# Patient Record
Sex: Female | Born: 1955 | Race: White | Hispanic: No | Marital: Married | Smoking: Never smoker
Health system: Southern US, Community
[De-identification: ages and names within clinical notes are randomized; demographics above are authoritative.]

## PROBLEM LIST (undated history)

## (undated) DIAGNOSIS — J301 Allergic rhinitis due to pollen: Secondary | ICD-10-CM

## (undated) DIAGNOSIS — I10 Essential (primary) hypertension: Secondary | ICD-10-CM

## (undated) DIAGNOSIS — G8929 Other chronic pain: Secondary | ICD-10-CM

## (undated) DIAGNOSIS — M7061 Trochanteric bursitis, right hip: Secondary | ICD-10-CM

## (undated) DIAGNOSIS — N200 Calculus of kidney: Secondary | ICD-10-CM

## (undated) DIAGNOSIS — K219 Gastro-esophageal reflux disease without esophagitis: Secondary | ICD-10-CM

## (undated) DIAGNOSIS — E785 Hyperlipidemia, unspecified: Secondary | ICD-10-CM

## (undated) DIAGNOSIS — R002 Palpitations: Secondary | ICD-10-CM

## (undated) DIAGNOSIS — T7840XA Allergy, unspecified, initial encounter: Secondary | ICD-10-CM

## (undated) HISTORY — DX: Gastro-esophageal reflux disease without esophagitis: K21.9

## (undated) HISTORY — PX: OTHER SURGICAL HISTORY: SHX169

## (undated) HISTORY — DX: Trochanteric bursitis, right hip: M70.61

## (undated) HISTORY — DX: Allergy, unspecified, initial encounter: T78.40XA

## (undated) HISTORY — PX: TONSILLECTOMY: SUR1361

## (undated) HISTORY — DX: Essential (primary) hypertension: I10

## (undated) HISTORY — DX: Other chronic pain: G89.29

## (undated) HISTORY — DX: Palpitations: R00.2

## (undated) HISTORY — DX: Allergic rhinitis due to pollen: J30.1

## (undated) HISTORY — PX: APPENDECTOMY: SHX54

## (undated) HISTORY — DX: Hyperlipidemia, unspecified: E78.5

## (undated) HISTORY — DX: Calculus of kidney: N20.0

---

## 2014-02-07 ENCOUNTER — Ambulatory Visit: Payer: Self-pay | Admitting: Internal Medicine

## 2014-04-15 ENCOUNTER — Ambulatory Visit: Payer: Self-pay | Admitting: Internal Medicine

## 2014-08-19 ENCOUNTER — Ambulatory Visit (INDEPENDENT_AMBULATORY_CARE_PROVIDER_SITE_OTHER): Payer: BLUE CROSS/BLUE SHIELD

## 2014-08-19 ENCOUNTER — Ambulatory Visit (INDEPENDENT_AMBULATORY_CARE_PROVIDER_SITE_OTHER): Payer: BLUE CROSS/BLUE SHIELD | Admitting: Podiatry

## 2014-08-19 ENCOUNTER — Encounter: Payer: Self-pay | Admitting: Podiatry

## 2014-08-19 VITALS — BP 105/50 | HR 65 | Resp 16 | Ht 60.0 in | Wt 125.0 lb

## 2014-08-19 DIAGNOSIS — M722 Plantar fascial fibromatosis: Secondary | ICD-10-CM | POA: Diagnosis not present

## 2014-08-19 MED ORDER — MELOXICAM 15 MG PO TABS: 15.0000 mg | ORAL_TABLET | Freq: Every day | ORAL | Status: DC

## 2014-08-19 MED ORDER — METHYLPREDNISOLONE 4 MG PO TBPK: ORAL_TABLET | ORAL | Status: DC

## 2014-08-19 NOTE — Patient Instructions (Signed)

## 2014-08-19 NOTE — Progress Notes (Signed)
   Subjective:    Patient ID: Alexa Navarro, female    DOB: Feb 16, 1956, 59 y.o.   MRN: [id number]  HPI Comments: "I have pain in the foot"  Patient c/o aching plantar heel and ankle left for several months. She was treated for a stress fracture 5 years ago. She got a pair of insoles from [employer]. They were not helping. She has tried OTC insoles with more cushion recently-helps some. Notices pain shooting into knee and back. She does see a chiropractor for back.   She also c/o pain along the medial borders of both toenails 1st bilateral (L>R)     Review of Systems  HENT: Positive for hearing loss and tinnitus.   Musculoskeletal: Positive for back pain, arthralgias and gait problem.  All other systems reviewed and are negative.      Objective:   Physical Exam: I have reviewed her past history medications allergies surgery social history and review systems. Pulses are strongly palpable bilateral. Neurologic sensorium is intact for Semmes-Weinstein monofilament. Deep tendon reflexes are intact bilaterally muscle strength +5 over 5 dorsiflexors plantar flexors and inverters everters all Jones musculature is intact. Orthopedic evaluation demonstrates all joints distal to the ankle for range of motion without crepitation. Cutaneous evaluation demonstrates supple well-hydrated cutis. She has pain on palpation medial calcaneal tubercle of the left heel. Radiographs confirm soft tissue increase in density at the plantar fascial calcaneal insertion site.        Assessment & Plan:  Assessment: Plantar fasciitis left.  Plan: Discussed etiology pathology conservative versus surgical therapies. Injected the heel today with Kenalog and local anesthetic. Dispensed a plantar fascial brace and a night splint. Discussed the appropriate shoe gear stretching exercises ice therapy issue modifications. Dispensed a prescription for Medrol Dosepak to be followed by meloxicam. I will follow-up with her 1  month.

## 2014-09-18 ENCOUNTER — Ambulatory Visit (INDEPENDENT_AMBULATORY_CARE_PROVIDER_SITE_OTHER): Payer: BLUE CROSS/BLUE SHIELD | Admitting: Podiatry

## 2014-09-18 ENCOUNTER — Encounter: Payer: Self-pay | Admitting: Podiatry

## 2014-09-18 VITALS — BP 106/58 | HR 69 | Resp 16

## 2014-09-18 DIAGNOSIS — L600 Ingrowing nail: Secondary | ICD-10-CM | POA: Diagnosis not present

## 2014-09-18 DIAGNOSIS — M722 Plantar fascial fibromatosis: Secondary | ICD-10-CM | POA: Diagnosis not present

## 2014-09-18 NOTE — Progress Notes (Signed)
She states that her left heel is approximately 75% better and continues to remain that way as long as she continues to therapies. She has discontinued the use the meloxicam and lunate of Aleve twice a day. She continues to use a night splint.  Objective: Vital signs are stable she is alert and oriented 3. Pulses are strongly palpable left foot. She still has pain on palpation medial continued tubercle of the left heel much improved since last visit.  Assessment: Slowly resolving plantar fasciitis left.  Plan: Reinjected the left heel again today continue all conservative therapies follow up with her in 1 month if necessary for orthotics. She now has an 8 hour a day job and is standing twice as much as she used to be. She also has some tenderness along the tibial border of the hallux nail plate left which we will evaluate for matrixectomy next visit provided we do not have to use any cortisone.

## 2014-10-16 ENCOUNTER — Ambulatory Visit: Payer: BLUE CROSS/BLUE SHIELD | Admitting: Podiatry

## 2014-12-23 ENCOUNTER — Encounter: Payer: Self-pay | Admitting: Podiatry

## 2014-12-23 ENCOUNTER — Ambulatory Visit (INDEPENDENT_AMBULATORY_CARE_PROVIDER_SITE_OTHER): Payer: BLUE CROSS/BLUE SHIELD | Admitting: Podiatry

## 2014-12-23 VITALS — BP 96/61 | HR 71 | Resp 16

## 2014-12-23 DIAGNOSIS — L600 Ingrowing nail: Secondary | ICD-10-CM | POA: Diagnosis not present

## 2014-12-23 MED ORDER — NEOMYCIN-POLYMYXIN-HC 1 % OT SOLN: OTIC | Status: DC

## 2014-12-23 NOTE — Patient Instructions (Signed)

## 2014-12-23 NOTE — Progress Notes (Signed)
She presents today for follow-up of her plantar fasciitis which she states is doing much better but she is also concerned about the second digit of the left foot as the nail plate turns medially and has developed a bump. She states that the bump is sore and painful and bothers her with shoe gear.  Objective: Vital signs are stable alert and oriented 3. Pulses are palpable. She has pain on palpation the proximal medial foot nail fold second digit left foot. She has mild erythema no cellulitis drainage or odor. Neurologic sensorium is intact deep tendon reflexes are intact and muscle strength is normal.  Assessment: Resolving plantar fasciitis left heel. Nail dystrophy with painful proximal knee medial nail fold. Mild paronychia.  Plan: We discussed the etiology pathology conservative versus surgical therapies. At this point we performed a total nail avulsion temporary in nature after local anesthesia was administered. She tolerated this procedure well and immediately notice a decrease in the nodularity of the proximal medial nail fold. I will follow-up with her in 1 week. As this nail grows back we will watch for redevelopment of the lesion.

## 2014-12-30 ENCOUNTER — Encounter: Payer: Self-pay | Admitting: Podiatry

## 2014-12-30 ENCOUNTER — Ambulatory Visit (INDEPENDENT_AMBULATORY_CARE_PROVIDER_SITE_OTHER): Payer: BLUE CROSS/BLUE SHIELD | Admitting: Podiatry

## 2014-12-30 DIAGNOSIS — L600 Ingrowing nail: Secondary | ICD-10-CM

## 2014-12-30 NOTE — Progress Notes (Signed)
She presents today for follow-up of matrixectomy fibular border second digit of the right foot. She continues to soak in Betadine warm water and apply Cortisporin Otic daily and cover.  Objective: Vital signs are stable she is alert and oriented 3. Pulses are palpable. Matrixectomy fibular border second digit right foot demonstrates well-healing surgical site. Some mild eschar is present.  Assessment: Well-healing matrixectomy second digit right foot.  Plan: Discontinue Betadine start with Epsom salts and warm water soaks covered during the day and leave open at night. Continue second to completely resolve.

## 2014-12-30 NOTE — Patient Instructions (Signed)

## 2015-03-17 ENCOUNTER — Other Ambulatory Visit: Payer: Self-pay | Admitting: Internal Medicine

## 2015-03-17 DIAGNOSIS — Z1231 Encounter for screening mammogram for malignant neoplasm of breast: Secondary | ICD-10-CM

## 2015-03-25 ENCOUNTER — Ambulatory Visit
Admission: RE | Admit: 2015-03-25 | Discharge: 2015-03-25 | Disposition: A | Discharge status: Home / Self Care | Payer: BLUE CROSS/BLUE SHIELD | Source: Ambulatory Visit | Attending: Internal Medicine | Admitting: Internal Medicine

## 2015-03-25 DIAGNOSIS — Z1231 Encounter for screening mammogram for malignant neoplasm of breast: Secondary | ICD-10-CM | POA: Insufficient documentation

## 2015-03-26 ENCOUNTER — Other Ambulatory Visit: Payer: Self-pay | Admitting: Internal Medicine

## 2015-03-26 DIAGNOSIS — N6320 Unspecified lump in the left breast, unspecified quadrant: Secondary | ICD-10-CM

## 2015-03-26 DIAGNOSIS — R928 Other abnormal and inconclusive findings on diagnostic imaging of breast: Secondary | ICD-10-CM

## 2015-04-17 ENCOUNTER — Ambulatory Visit
Admission: RE | Admit: 2015-04-17 | Discharge: 2015-04-17 | Disposition: A | Discharge status: Home / Self Care | Payer: BLUE CROSS/BLUE SHIELD | Source: Ambulatory Visit | Attending: Internal Medicine | Admitting: Internal Medicine

## 2015-04-17 DIAGNOSIS — N63 Unspecified lump in breast: Secondary | ICD-10-CM | POA: Diagnosis not present

## 2015-04-17 DIAGNOSIS — R928 Other abnormal and inconclusive findings on diagnostic imaging of breast: Secondary | ICD-10-CM

## 2015-04-17 DIAGNOSIS — N6320 Unspecified lump in the left breast, unspecified quadrant: Secondary | ICD-10-CM

## 2015-12-18 ENCOUNTER — Encounter: Payer: Self-pay | Admitting: Podiatry

## 2015-12-18 ENCOUNTER — Ambulatory Visit (INDEPENDENT_AMBULATORY_CARE_PROVIDER_SITE_OTHER): Payer: BLUE CROSS/BLUE SHIELD | Admitting: Podiatry

## 2015-12-18 ENCOUNTER — Ambulatory Visit (INDEPENDENT_AMBULATORY_CARE_PROVIDER_SITE_OTHER): Payer: BLUE CROSS/BLUE SHIELD

## 2015-12-18 DIAGNOSIS — R52 Pain, unspecified: Secondary | ICD-10-CM

## 2015-12-18 DIAGNOSIS — M792 Neuralgia and neuritis, unspecified: Secondary | ICD-10-CM

## 2015-12-18 DIAGNOSIS — M2040 Other hammer toe(s) (acquired), unspecified foot: Secondary | ICD-10-CM | POA: Diagnosis not present

## 2015-12-25 NOTE — Progress Notes (Signed)
Subjective: 60 year old female presents the office today for concerns of right foot pain which is been ongoing for the last 3 weeks and she feels that there is a knot on the right third toe. She relates some numbness to the second and third toe on the right foot. She denies any recent injury or trauma. Denies any swelling. She states that she feels that she is walking on a towel to this toes.Denies any systemic complaints such as fevers, chills, nausea, vomiting. No acute changes since last appointment, and no other complaints at this time. She did get new inserts and she isn't wondering if this is causing the problems to her toes.  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 On the plantar aspect of the right third toe does appear to be a small amount of fluid on the plantar aspect of the right third toe plantar to the PIPJ. There is no overlying erythema or increase in warmth. There is no palpable neuroma identified. There is no area pinpoint bony tenderness or pain the vibratory sensation. No edema, erythema, increase in warmth to bilateral lower extremities.  No open lesions or pre-ulcerative lesions.  No pain with calf compression, swelling, warmth, erythema  Assessment: Hammertoes likely causing neuritis versus possible neuroma;  plantar plate injury  Plan: -All treatment options discussed with the patient including all alternatives, risks, complications.  -Etiology of symptoms were discussed -X-rays were obtained and reviewed with the patient.  -Toe crests were dispensed as well as offloading pads. Also help hold the third toe in a plantarflexed position to take pressure off the plantar plate and the tendon. If symptoms continue possible MRI to rule out tear however her symptoms have improved of the last couple weeks. Also discussed strengthening exercises for toes. -Follow-up as scheduled  or sooner if needed. -Patient encouraged to call the office with any questions, concerns, change in symptoms.   Celesta Gentile, DPM

## 2016-05-14 ENCOUNTER — Other Ambulatory Visit: Payer: Self-pay | Admitting: Internal Medicine

## 2016-05-14 DIAGNOSIS — Z1231 Encounter for screening mammogram for malignant neoplasm of breast: Secondary | ICD-10-CM

## 2016-05-21 ENCOUNTER — Ambulatory Visit
Admission: RE | Admit: 2016-05-21 | Discharge: 2016-05-21 | Disposition: A | Discharge status: Home / Self Care | Payer: BLUE CROSS/BLUE SHIELD | Source: Ambulatory Visit | Attending: Internal Medicine | Admitting: Internal Medicine

## 2016-05-21 DIAGNOSIS — Z1231 Encounter for screening mammogram for malignant neoplasm of breast: Secondary | ICD-10-CM | POA: Insufficient documentation

## 2017-06-09 ENCOUNTER — Other Ambulatory Visit: Payer: Self-pay | Admitting: Internal Medicine

## 2017-06-09 DIAGNOSIS — Z1231 Encounter for screening mammogram for malignant neoplasm of breast: Secondary | ICD-10-CM

## 2017-06-17 ENCOUNTER — Ambulatory Visit
Admission: RE | Admit: 2017-06-17 | Discharge: 2017-06-17 | Disposition: A | Discharge status: Home / Self Care | Payer: BLUE CROSS/BLUE SHIELD | Source: Ambulatory Visit | Attending: Internal Medicine | Admitting: Internal Medicine

## 2017-06-17 DIAGNOSIS — Z1231 Encounter for screening mammogram for malignant neoplasm of breast: Secondary | ICD-10-CM

## 2018-06-12 ENCOUNTER — Other Ambulatory Visit: Payer: Self-pay | Admitting: Internal Medicine

## 2018-06-12 DIAGNOSIS — Z1231 Encounter for screening mammogram for malignant neoplasm of breast: Secondary | ICD-10-CM

## 2018-06-19 ENCOUNTER — Ambulatory Visit
Admission: RE | Admit: 2018-06-19 | Discharge: 2018-06-19 | Disposition: A | Discharge status: Home / Self Care | Payer: BLUE CROSS/BLUE SHIELD | Source: Ambulatory Visit | Attending: Internal Medicine | Admitting: Internal Medicine

## 2018-06-19 DIAGNOSIS — Z1231 Encounter for screening mammogram for malignant neoplasm of breast: Secondary | ICD-10-CM

## 2019-06-15 ENCOUNTER — Other Ambulatory Visit: Payer: Self-pay | Admitting: Internal Medicine

## 2019-06-15 DIAGNOSIS — Z1231 Encounter for screening mammogram for malignant neoplasm of breast: Secondary | ICD-10-CM

## 2019-06-26 ENCOUNTER — Other Ambulatory Visit: Payer: Self-pay

## 2019-06-26 ENCOUNTER — Ambulatory Visit
Admission: RE | Admit: 2019-06-26 | Discharge: 2019-06-26 | Disposition: A | Discharge status: Home / Self Care | Payer: BC Managed Care – PPO | Source: Ambulatory Visit | Attending: Internal Medicine | Admitting: Internal Medicine

## 2019-06-26 DIAGNOSIS — Z1231 Encounter for screening mammogram for malignant neoplasm of breast: Secondary | ICD-10-CM | POA: Insufficient documentation

## 2019-07-13 ENCOUNTER — Ambulatory Visit: Payer: BC Managed Care – PPO | Attending: Internal Medicine

## 2019-07-13 ENCOUNTER — Other Ambulatory Visit: Payer: Self-pay

## 2019-07-13 DIAGNOSIS — Z23 Encounter for immunization: Secondary | ICD-10-CM

## 2019-07-13 NOTE — Progress Notes (Signed)
   Covid-19 Vaccination Clinic  Name:  Alexa Navarro    MRN: [id number] DOB: 1956/02/06  07/13/2019  Alexa Navarro was observed post Covid-19 immunization for 15 minutes without incident. She was provided with Vaccine Information Sheet and instruction to access the V-Safe system.   Alexa Navarro was instructed to call 911 with any severe reactions post vaccine: Marland Kitchen Difficulty breathing  . Swelling of face and throat  . A fast heartbeat  . A bad rash all over body  . Dizziness and weakness   Immunizations Administered    Name Date Dose VIS Date Route   Pfizer COVID-19 Vaccine 07/13/2019  9:11 AM 0.3 mL 03/23/2019 Intramuscular   Manufacturer: [place]   Lot: [id number]   [place]: [id number]

## 2019-08-08 ENCOUNTER — Ambulatory Visit: Payer: BC Managed Care – PPO | Attending: Internal Medicine

## 2019-08-08 DIAGNOSIS — Z23 Encounter for immunization: Secondary | ICD-10-CM

## 2019-08-08 NOTE — Progress Notes (Signed)
   Covid-19 Vaccination Clinic  Name:  Alexa Navarro    MRN: [id number] DOB: 1956-01-18  08/08/2019  [name] was observed post Covid-19 immunization for 15 minutes without incident. She was provided with Vaccine Information Sheet and instruction to access the V-Safe system.   [name] was instructed to call 911 with any severe reactions post vaccine: Marland Kitchen Difficulty breathing  . Swelling of face and throat  . A fast heartbeat  . A bad rash all over body  . Dizziness and weakness   Immunizations Administered    Name Date Dose VIS Date Route   Pfizer COVID-19 Vaccine 08/08/2019  8:32 AM 0.3 mL 06/06/2018 Intramuscular   Manufacturer: [place]   Lot: [id number]   [place]: [id number]

## 2020-04-21 DIAGNOSIS — H524 Presbyopia: Secondary | ICD-10-CM | POA: Diagnosis not present

## 2020-04-21 DIAGNOSIS — Z0100 Encounter for examination of eyes and vision without abnormal findings: Secondary | ICD-10-CM | POA: Diagnosis not present

## 2020-05-05 DIAGNOSIS — D23122 Other benign neoplasm of skin of left lower eyelid, including canthus: Secondary | ICD-10-CM | POA: Diagnosis not present

## 2020-05-05 DIAGNOSIS — L821 Other seborrheic keratosis: Secondary | ICD-10-CM | POA: Diagnosis not present

## 2020-05-08 DIAGNOSIS — H02825 Cysts of left lower eyelid: Secondary | ICD-10-CM | POA: Diagnosis not present

## 2020-05-08 DIAGNOSIS — H02821 Cysts of right upper eyelid: Secondary | ICD-10-CM | POA: Diagnosis not present

## 2020-05-27 ENCOUNTER — Other Ambulatory Visit: Payer: Self-pay | Admitting: Internal Medicine

## 2020-05-27 DIAGNOSIS — J3089 Other allergic rhinitis: Secondary | ICD-10-CM | POA: Diagnosis not present

## 2020-05-27 DIAGNOSIS — R29890 Loss of height: Secondary | ICD-10-CM | POA: Diagnosis not present

## 2020-05-27 DIAGNOSIS — E782 Mixed hyperlipidemia: Secondary | ICD-10-CM | POA: Diagnosis not present

## 2020-05-27 DIAGNOSIS — N959 Unspecified menopausal and perimenopausal disorder: Secondary | ICD-10-CM | POA: Diagnosis not present

## 2020-05-27 DIAGNOSIS — R002 Palpitations: Secondary | ICD-10-CM | POA: Diagnosis not present

## 2020-05-27 DIAGNOSIS — Z1231 Encounter for screening mammogram for malignant neoplasm of breast: Secondary | ICD-10-CM

## 2020-05-28 DIAGNOSIS — R002 Palpitations: Secondary | ICD-10-CM | POA: Diagnosis not present

## 2020-05-28 DIAGNOSIS — J3089 Other allergic rhinitis: Secondary | ICD-10-CM | POA: Diagnosis not present

## 2020-05-28 DIAGNOSIS — E782 Mixed hyperlipidemia: Secondary | ICD-10-CM | POA: Diagnosis not present

## 2020-06-16 DIAGNOSIS — K219 Gastro-esophageal reflux disease without esophagitis: Secondary | ICD-10-CM | POA: Diagnosis not present

## 2020-06-16 DIAGNOSIS — I341 Nonrheumatic mitral (valve) prolapse: Secondary | ICD-10-CM | POA: Diagnosis not present

## 2020-06-16 DIAGNOSIS — Z86010 Personal history of colonic polyps: Secondary | ICD-10-CM | POA: Diagnosis not present

## 2020-06-26 ENCOUNTER — Ambulatory Visit
Admission: RE | Admit: 2020-06-26 | Discharge: 2020-06-26 | Disposition: A | Discharge status: Home / Self Care | Payer: Medicare HMO | Source: Ambulatory Visit | Attending: Internal Medicine | Admitting: Internal Medicine

## 2020-06-26 ENCOUNTER — Other Ambulatory Visit: Payer: Self-pay

## 2020-06-26 DIAGNOSIS — Z1231 Encounter for screening mammogram for malignant neoplasm of breast: Secondary | ICD-10-CM | POA: Insufficient documentation

## 2020-07-03 DIAGNOSIS — D485 Neoplasm of uncertain behavior of skin: Secondary | ICD-10-CM | POA: Diagnosis not present

## 2020-08-12 DIAGNOSIS — Z01818 Encounter for other preprocedural examination: Secondary | ICD-10-CM | POA: Diagnosis not present

## 2020-08-15 DIAGNOSIS — K5730 Diverticulosis of large intestine without perforation or abscess without bleeding: Secondary | ICD-10-CM | POA: Diagnosis not present

## 2020-08-15 DIAGNOSIS — K640 First degree hemorrhoids: Secondary | ICD-10-CM | POA: Diagnosis not present

## 2020-08-15 DIAGNOSIS — K635 Polyp of colon: Secondary | ICD-10-CM | POA: Diagnosis not present

## 2020-08-15 DIAGNOSIS — Z86010 Personal history of colonic polyps: Secondary | ICD-10-CM | POA: Diagnosis not present

## 2020-09-29 DIAGNOSIS — I10 Essential (primary) hypertension: Secondary | ICD-10-CM | POA: Diagnosis not present

## 2020-09-29 DIAGNOSIS — E782 Mixed hyperlipidemia: Secondary | ICD-10-CM | POA: Diagnosis not present

## 2020-09-29 DIAGNOSIS — R5383 Other fatigue: Secondary | ICD-10-CM | POA: Diagnosis not present

## 2020-09-30 DIAGNOSIS — H02729 Madarosis of unspecified eye, unspecified eyelid and periocular area: Secondary | ICD-10-CM | POA: Diagnosis not present

## 2020-09-30 DIAGNOSIS — L668 Other cicatricial alopecia: Secondary | ICD-10-CM | POA: Diagnosis not present

## 2020-10-06 DIAGNOSIS — E559 Vitamin D deficiency, unspecified: Secondary | ICD-10-CM | POA: Diagnosis not present

## 2020-12-29 DIAGNOSIS — Z23 Encounter for immunization: Secondary | ICD-10-CM | POA: Diagnosis not present

## 2020-12-29 DIAGNOSIS — K2100 Gastro-esophageal reflux disease with esophagitis, without bleeding: Secondary | ICD-10-CM | POA: Diagnosis not present

## 2020-12-29 DIAGNOSIS — K219 Gastro-esophageal reflux disease without esophagitis: Secondary | ICD-10-CM | POA: Diagnosis not present

## 2020-12-29 DIAGNOSIS — E782 Mixed hyperlipidemia: Secondary | ICD-10-CM | POA: Diagnosis not present

## 2020-12-29 DIAGNOSIS — J3089 Other allergic rhinitis: Secondary | ICD-10-CM | POA: Diagnosis not present

## 2021-04-27 DIAGNOSIS — H524 Presbyopia: Secondary | ICD-10-CM | POA: Diagnosis not present

## 2021-04-30 ENCOUNTER — Other Ambulatory Visit: Payer: Self-pay | Admitting: Internal Medicine

## 2021-04-30 DIAGNOSIS — R002 Palpitations: Secondary | ICD-10-CM | POA: Diagnosis not present

## 2021-04-30 DIAGNOSIS — J3089 Other allergic rhinitis: Secondary | ICD-10-CM | POA: Diagnosis not present

## 2021-04-30 DIAGNOSIS — Z1231 Encounter for screening mammogram for malignant neoplasm of breast: Secondary | ICD-10-CM

## 2021-04-30 DIAGNOSIS — E782 Mixed hyperlipidemia: Secondary | ICD-10-CM | POA: Diagnosis not present

## 2021-05-05 DIAGNOSIS — D225 Melanocytic nevi of trunk: Secondary | ICD-10-CM | POA: Diagnosis not present

## 2021-05-05 DIAGNOSIS — D2262 Melanocytic nevi of left upper limb, including shoulder: Secondary | ICD-10-CM | POA: Diagnosis not present

## 2021-05-05 DIAGNOSIS — D2271 Melanocytic nevi of right lower limb, including hip: Secondary | ICD-10-CM | POA: Diagnosis not present

## 2021-05-05 DIAGNOSIS — B078 Other viral warts: Secondary | ICD-10-CM | POA: Diagnosis not present

## 2021-05-05 DIAGNOSIS — L821 Other seborrheic keratosis: Secondary | ICD-10-CM | POA: Diagnosis not present

## 2021-05-05 DIAGNOSIS — L668 Other cicatricial alopecia: Secondary | ICD-10-CM | POA: Diagnosis not present

## 2021-05-05 DIAGNOSIS — D2272 Melanocytic nevi of left lower limb, including hip: Secondary | ICD-10-CM | POA: Diagnosis not present

## 2021-05-05 DIAGNOSIS — L918 Other hypertrophic disorders of the skin: Secondary | ICD-10-CM | POA: Diagnosis not present

## 2021-05-05 DIAGNOSIS — D2261 Melanocytic nevi of right upper limb, including shoulder: Secondary | ICD-10-CM | POA: Diagnosis not present

## 2021-06-26 DIAGNOSIS — R0602 Shortness of breath: Secondary | ICD-10-CM | POA: Diagnosis not present

## 2021-06-26 DIAGNOSIS — I10 Essential (primary) hypertension: Secondary | ICD-10-CM | POA: Diagnosis not present

## 2021-06-26 DIAGNOSIS — I340 Nonrheumatic mitral (valve) insufficiency: Secondary | ICD-10-CM | POA: Diagnosis not present

## 2021-06-26 DIAGNOSIS — R002 Palpitations: Secondary | ICD-10-CM | POA: Diagnosis not present

## 2021-06-26 DIAGNOSIS — I209 Angina pectoris, unspecified: Secondary | ICD-10-CM | POA: Diagnosis not present

## 2021-06-29 ENCOUNTER — Ambulatory Visit
Admission: RE | Admit: 2021-06-29 | Discharge: 2021-06-29 | Disposition: A | Discharge status: Home / Self Care | Payer: Medicare HMO | Source: Ambulatory Visit | Attending: Internal Medicine | Admitting: Internal Medicine

## 2021-06-29 ENCOUNTER — Other Ambulatory Visit: Payer: Self-pay

## 2021-06-29 DIAGNOSIS — Z1231 Encounter for screening mammogram for malignant neoplasm of breast: Secondary | ICD-10-CM | POA: Insufficient documentation

## 2021-07-08 DIAGNOSIS — I209 Angina pectoris, unspecified: Secondary | ICD-10-CM | POA: Diagnosis not present

## 2021-07-10 DIAGNOSIS — R0602 Shortness of breath: Secondary | ICD-10-CM | POA: Diagnosis not present

## 2021-07-10 DIAGNOSIS — I509 Heart failure, unspecified: Secondary | ICD-10-CM | POA: Diagnosis not present

## 2021-07-10 DIAGNOSIS — I209 Angina pectoris, unspecified: Secondary | ICD-10-CM | POA: Diagnosis not present

## 2021-07-10 DIAGNOSIS — I10 Essential (primary) hypertension: Secondary | ICD-10-CM | POA: Diagnosis not present

## 2021-07-13 DIAGNOSIS — I209 Angina pectoris, unspecified: Secondary | ICD-10-CM | POA: Diagnosis not present

## 2021-07-13 DIAGNOSIS — I10 Essential (primary) hypertension: Secondary | ICD-10-CM | POA: Diagnosis not present

## 2021-07-13 DIAGNOSIS — I509 Heart failure, unspecified: Secondary | ICD-10-CM | POA: Diagnosis not present

## 2021-07-13 DIAGNOSIS — R0602 Shortness of breath: Secondary | ICD-10-CM | POA: Diagnosis not present

## 2021-07-22 DIAGNOSIS — N390 Urinary tract infection, site not specified: Secondary | ICD-10-CM | POA: Diagnosis not present

## 2021-07-22 DIAGNOSIS — N760 Acute vaginitis: Secondary | ICD-10-CM | POA: Diagnosis not present

## 2021-08-07 DIAGNOSIS — R319 Hematuria, unspecified: Secondary | ICD-10-CM | POA: Diagnosis not present

## 2021-08-07 DIAGNOSIS — W57XXXA Bitten or stung by nonvenomous insect and other nonvenomous arthropods, initial encounter: Secondary | ICD-10-CM | POA: Diagnosis not present

## 2021-08-07 DIAGNOSIS — N3001 Acute cystitis with hematuria: Secondary | ICD-10-CM | POA: Diagnosis not present

## 2021-08-13 DIAGNOSIS — I10 Essential (primary) hypertension: Secondary | ICD-10-CM | POA: Diagnosis not present

## 2021-08-13 DIAGNOSIS — R0602 Shortness of breath: Secondary | ICD-10-CM | POA: Diagnosis not present

## 2021-08-13 DIAGNOSIS — E782 Mixed hyperlipidemia: Secondary | ICD-10-CM | POA: Diagnosis not present

## 2021-08-13 DIAGNOSIS — I340 Nonrheumatic mitral (valve) insufficiency: Secondary | ICD-10-CM | POA: Diagnosis not present

## 2021-08-13 DIAGNOSIS — I509 Heart failure, unspecified: Secondary | ICD-10-CM | POA: Diagnosis not present

## 2021-08-20 DIAGNOSIS — I10 Essential (primary) hypertension: Secondary | ICD-10-CM | POA: Diagnosis not present

## 2021-08-20 DIAGNOSIS — R109 Unspecified abdominal pain: Secondary | ICD-10-CM | POA: Diagnosis not present

## 2021-08-20 DIAGNOSIS — E782 Mixed hyperlipidemia: Secondary | ICD-10-CM | POA: Diagnosis not present

## 2021-08-28 DIAGNOSIS — N200 Calculus of kidney: Secondary | ICD-10-CM | POA: Diagnosis not present

## 2021-08-28 DIAGNOSIS — D72828 Other elevated white blood cell count: Secondary | ICD-10-CM | POA: Diagnosis not present

## 2021-08-28 DIAGNOSIS — E782 Mixed hyperlipidemia: Secondary | ICD-10-CM | POA: Diagnosis not present

## 2021-08-31 DIAGNOSIS — D72828 Other elevated white blood cell count: Secondary | ICD-10-CM | POA: Diagnosis not present

## 2021-12-11 ENCOUNTER — Other Ambulatory Visit: Payer: Self-pay | Admitting: Internal Medicine

## 2021-12-11 DIAGNOSIS — N281 Cyst of kidney, acquired: Secondary | ICD-10-CM | POA: Diagnosis not present

## 2021-12-11 DIAGNOSIS — Z1231 Encounter for screening mammogram for malignant neoplasm of breast: Secondary | ICD-10-CM | POA: Diagnosis not present

## 2021-12-11 DIAGNOSIS — K219 Gastro-esophageal reflux disease without esophagitis: Secondary | ICD-10-CM | POA: Diagnosis not present

## 2021-12-11 DIAGNOSIS — I10 Essential (primary) hypertension: Secondary | ICD-10-CM | POA: Diagnosis not present

## 2021-12-11 DIAGNOSIS — E782 Mixed hyperlipidemia: Secondary | ICD-10-CM | POA: Diagnosis not present

## 2021-12-11 DIAGNOSIS — N2889 Other specified disorders of kidney and ureter: Secondary | ICD-10-CM

## 2021-12-23 ENCOUNTER — Ambulatory Visit
Admission: RE | Admit: 2021-12-23 | Discharge: 2021-12-23 | Disposition: A | Discharge status: Home / Self Care | Payer: Medicare HMO | Source: Ambulatory Visit | Attending: Internal Medicine | Admitting: Internal Medicine

## 2021-12-23 DIAGNOSIS — K8020 Calculus of gallbladder without cholecystitis without obstruction: Secondary | ICD-10-CM | POA: Diagnosis not present

## 2021-12-23 DIAGNOSIS — N2889 Other specified disorders of kidney and ureter: Secondary | ICD-10-CM | POA: Diagnosis not present

## 2021-12-23 LAB — POCT I-STAT CREATININE: Creatinine, Ser: 1.4 mg/dL — ABNORMAL HIGH (ref 0.44–1.00)

## 2021-12-23 MED ORDER — IOHEXOL 300 MG/ML  SOLN
100.0000 mL | Freq: Once | INTRAMUSCULAR | Status: AC | PRN
  Administered 2021-12-23: 80 mL via INTRAVENOUS

## 2022-01-14 DIAGNOSIS — N200 Calculus of kidney: Secondary | ICD-10-CM | POA: Diagnosis not present

## 2022-01-14 DIAGNOSIS — Z23 Encounter for immunization: Secondary | ICD-10-CM | POA: Diagnosis not present

## 2022-01-14 DIAGNOSIS — Z0000 Encounter for general adult medical examination without abnormal findings: Secondary | ICD-10-CM | POA: Diagnosis not present

## 2022-01-14 DIAGNOSIS — J301 Allergic rhinitis due to pollen: Secondary | ICD-10-CM | POA: Diagnosis not present

## 2022-01-14 DIAGNOSIS — E782 Mixed hyperlipidemia: Secondary | ICD-10-CM | POA: Diagnosis not present

## 2022-01-14 DIAGNOSIS — K219 Gastro-esophageal reflux disease without esophagitis: Secondary | ICD-10-CM | POA: Diagnosis not present

## 2022-01-14 DIAGNOSIS — R002 Palpitations: Secondary | ICD-10-CM | POA: Diagnosis not present

## 2022-03-22 DIAGNOSIS — M9903 Segmental and somatic dysfunction of lumbar region: Secondary | ICD-10-CM | POA: Diagnosis not present

## 2022-03-22 DIAGNOSIS — M5431 Sciatica, right side: Secondary | ICD-10-CM | POA: Diagnosis not present

## 2022-03-22 DIAGNOSIS — M5432 Sciatica, left side: Secondary | ICD-10-CM | POA: Diagnosis not present

## 2022-03-22 DIAGNOSIS — M9904 Segmental and somatic dysfunction of sacral region: Secondary | ICD-10-CM | POA: Diagnosis not present

## 2022-03-24 DIAGNOSIS — M5431 Sciatica, right side: Secondary | ICD-10-CM | POA: Diagnosis not present

## 2022-03-24 DIAGNOSIS — M9904 Segmental and somatic dysfunction of sacral region: Secondary | ICD-10-CM | POA: Diagnosis not present

## 2022-03-24 DIAGNOSIS — M5432 Sciatica, left side: Secondary | ICD-10-CM | POA: Diagnosis not present

## 2022-03-24 DIAGNOSIS — M9903 Segmental and somatic dysfunction of lumbar region: Secondary | ICD-10-CM | POA: Diagnosis not present

## 2022-04-07 DIAGNOSIS — M5431 Sciatica, right side: Secondary | ICD-10-CM | POA: Diagnosis not present

## 2022-04-07 DIAGNOSIS — M9904 Segmental and somatic dysfunction of sacral region: Secondary | ICD-10-CM | POA: Diagnosis not present

## 2022-04-07 DIAGNOSIS — M5432 Sciatica, left side: Secondary | ICD-10-CM | POA: Diagnosis not present

## 2022-04-07 DIAGNOSIS — M9903 Segmental and somatic dysfunction of lumbar region: Secondary | ICD-10-CM | POA: Diagnosis not present

## 2022-04-09 DIAGNOSIS — M9903 Segmental and somatic dysfunction of lumbar region: Secondary | ICD-10-CM | POA: Diagnosis not present

## 2022-04-09 DIAGNOSIS — M5432 Sciatica, left side: Secondary | ICD-10-CM | POA: Diagnosis not present

## 2022-04-09 DIAGNOSIS — M5431 Sciatica, right side: Secondary | ICD-10-CM | POA: Diagnosis not present

## 2022-04-09 DIAGNOSIS — M9904 Segmental and somatic dysfunction of sacral region: Secondary | ICD-10-CM | POA: Diagnosis not present

## 2022-05-07 DIAGNOSIS — L821 Other seborrheic keratosis: Secondary | ICD-10-CM | POA: Diagnosis not present

## 2022-05-07 DIAGNOSIS — D2271 Melanocytic nevi of right lower limb, including hip: Secondary | ICD-10-CM | POA: Diagnosis not present

## 2022-05-07 DIAGNOSIS — L668 Other cicatricial alopecia: Secondary | ICD-10-CM | POA: Diagnosis not present

## 2022-05-07 DIAGNOSIS — D2262 Melanocytic nevi of left upper limb, including shoulder: Secondary | ICD-10-CM | POA: Diagnosis not present

## 2022-05-07 DIAGNOSIS — D225 Melanocytic nevi of trunk: Secondary | ICD-10-CM | POA: Diagnosis not present

## 2022-05-07 DIAGNOSIS — L820 Inflamed seborrheic keratosis: Secondary | ICD-10-CM | POA: Diagnosis not present

## 2022-05-07 DIAGNOSIS — D2261 Melanocytic nevi of right upper limb, including shoulder: Secondary | ICD-10-CM | POA: Diagnosis not present

## 2022-05-07 DIAGNOSIS — D2272 Melanocytic nevi of left lower limb, including hip: Secondary | ICD-10-CM | POA: Diagnosis not present

## 2022-06-01 ENCOUNTER — Encounter: Payer: Self-pay | Admitting: Cardiovascular Disease

## 2022-06-01 ENCOUNTER — Ambulatory Visit: Payer: Medicare HMO | Admitting: Cardiovascular Disease

## 2022-06-01 VITALS — BP 120/78 | HR 87 | Ht 60.0 in | Wt 137.4 lb

## 2022-06-01 DIAGNOSIS — E785 Hyperlipidemia, unspecified: Secondary | ICD-10-CM | POA: Insufficient documentation

## 2022-06-01 DIAGNOSIS — R0602 Shortness of breath: Secondary | ICD-10-CM

## 2022-06-01 DIAGNOSIS — I341 Nonrheumatic mitral (valve) prolapse: Secondary | ICD-10-CM

## 2022-06-01 DIAGNOSIS — E782 Mixed hyperlipidemia: Secondary | ICD-10-CM

## 2022-06-01 DIAGNOSIS — R002 Palpitations: Secondary | ICD-10-CM | POA: Diagnosis not present

## 2022-06-01 MED ORDER — SPIRONOLACTONE 25 MG PO TABS: 25.0000 mg | ORAL_TABLET | Freq: Every day | ORAL | 3 refills | Status: DC

## 2022-06-01 NOTE — Progress Notes (Signed)
Cardiology Office Note   Date:  06/01/2022   ID:  Purpose, Barca 09-04-1955, MRN [id number]  PCP:  Perrin Maltese, MD  Cardiologist:  Neoma Laming, MD      History of Present Illness: Alexa Navarro is a 67 y.o. female who presents for  Chief Complaint  Patient presents with   Follow-up    Follow up/medication refills    Patient in office for routine cardiac exam. Denies chest pain, shortness of breath, edema, palpitations.        History reviewed. No pertinent past medical history.   History reviewed. No pertinent surgical history.   Current Outpatient Medications  Medication Sig Dispense Refill   ATENOLOL PO Take by mouth.     DiphenhydrAMINE HCl (ALLERGY MED PO) Take by mouth.     rosuvastatin (CRESTOR) 20 MG tablet Take 20 mg by mouth daily.     spironolactone (ALDACTONE) 25 MG tablet Take 1 tablet (25 mg total) by mouth daily. 90 tablet 3   No current facility-administered medications for this visit.    Allergies:   Patient has no known allergies.    Social History:   reports that she has never smoked. She does not have any smokeless tobacco history on file. She reports that she does not drink alcohol. No history on file for drug use.   Family History:  family history includes Breast cancer (age of onset: 47) in her paternal grandmother.    ROS:     Review of Systems  Constitutional: Negative.   HENT: Negative.    Eyes: Negative.   Respiratory: Negative.    Gastrointestinal: Negative.   Genitourinary: Negative.   Musculoskeletal: Negative.   Skin: Negative.   Neurological: Negative.   Endo/Heme/Allergies: Negative.   Psychiatric/Behavioral: Negative.    All other systems reviewed and are negative.   All other systems are reviewed and negative.   PHYSICAL EXAM: VS:  BP 120/78   Pulse 87   Ht 5' (1.524 m)   Wt 137 lb 6.4 oz (62.3 kg)   SpO2 94%   BMI 26.83 kg/m  , BMI Body mass index is 26.83 kg/m. Last weight:  Wt Readings from  Last 3 Encounters:  06/01/22 137 lb 6.4 oz (62.3 kg)  08/19/14 125 lb (56.7 kg)     Physical Exam Constitutional:      Appearance: Normal appearance.  Cardiovascular:     Rate and Rhythm: Normal rate and regular rhythm.     Heart sounds: Normal heart sounds.  Pulmonary:     Effort: Pulmonary effort is normal.     Breath sounds: Normal breath sounds.  Musculoskeletal:     Right lower leg: No edema.     Left lower leg: No edema.  Neurological:     Mental Status: She is alert.     EKG: none today  Recent Labs: 12/23/2021: Creatinine, Ser 1.40    Lipid Panel No results found for: "CHOL", "TRIG", "HDL", "CHOLHDL", "VLDL", "LDLCALC", "LDLDIRECT"    Other studies Reviewed:  REASON FOR VISIT  Visit for: Echocardiogram/I 20.9  Sex:   Female  wt= 134   lbs.  BP=124/80  Height= 60   inches.        TESTS  Imaging: Echocardiogram:  An echocardiogram in (2-d) mode was performed and in Doppler mode with color flow velocity mapping was performed. The aortic valve cusps are abnormal 1.1   cm, flow velocity 1.03   m/s, and systolic calculated mean flow gradient 3  mmHg. Mitral valve diastolic peak flow velocity E .543   m/s and E/A ratio 0.6. Aortic root diameter 2.7   cm. The LVOT internal diameter 2.4  cm and flow velocity was abnormal .624    m/s. LV systolic dimension [id number]  cm, diastolic [id number]   cm, posterior wall thickness 1.19   cm, fractional shortening 45.3 %, and EF 78.8  %. IVS thickness 1.05   cm. LA dimension 3.6 cm. Mitral Valve has Trace Regurgitation. Pulmonic Valve has Trace Regurgitation. Tricuspid Valve has Trace Regurgitation.     ASSESSMENT  Technically adequate study.  Normal chamber sizes.  Normal left ventricular systolic function.  Mild left ventricular hypertrophy with GRADE 1 (relaxation abnormality) diastolic dysfunction.  Normal right ventricular systolic function.  Normal right ventricular diastolic function.  Normal left ventricular wall  motion.  Normal right ventricular wall motion.  Trace pulmonary regurgitation.  Trace tricuspid regurgitation.  Normal pulmonary artery pressure.  Trace mitral regurgitation.  No pericardial effusion.  No LVH.     THERAPY   Referring physician: Dionisio David  Sonographer: Neoma Laming.    Neoma Laming MD  Electronically signed by: Neoma Laming     Date: 07/10/2021 14:35   TESTS                                                                                          [facility] [address] [place], [place] [address] [phone] STUDY:  Gated Stress / Rest Myocardial Perfusion Imaging Tomographic (SPECT) Including attenuation correction Wall Motion, Left Ventricular Ejection Fraction By Gated Technique.Treadmill Stress Test. SEX: Female   WEIGHT: 131 lbs   HEIGHT: 60 in   ARMS UP: YES/NO                                                                        REFERRING PHYSICIAN: Dr.Meka Lewan Humphrey Rolls  INDICATION FOR STUDY:  Angina pectoris                                                                                                                                                                                                                    TECHNIQUE:  Approximately 20 minutes following the intravenous administration of 9.6 mCi of Tc-74mSestamibi after stress testing in a reclined supine position with arms above their head if able to do so, gated SPECT imaging of the heart was performed. After about a 2hr break, the patient was injected intravenously with 31.5 mCi of Tc-970mestamibi.  Approximately 45 minutes later in the same position as stress imaging SPECT rest imaging of the heart was performed.  STRESS BY:  ShNeoma LamingMD PROTOCOL:  BrDarnell Level                                                                                       MAX PRED HR: 154                     85%: 131               75%: 116                                                                                                                   RESTING BP: 120/72  RESTING HR: 93  PEAK BP: 130/80    PEAK HR:  129 (83%)  EXERCISE DURATION: 7:01                                             METS: 8.5    REASON FOR TEST TERMINATION:  SOB                                                                                                                                 SYMPTOMS: SOB DUKE TREADMILL SCORE: 7                                      RISK: Low                                                                                                                                                                                                            EKG RESULTS: NSR. 95/min. No significant ST changes at peak exercise. Occasional PVC's.                                                              IMAGE QUALITY: Good  PERFUSION/WALL MOTION FINDINGS: EF = 75%. No perfusion defects, normal wall motion.                                                                           IMPRESSION: Normal stress test with normal LVEF.                                                                                                                                                                                                                                                                                         Neoma Laming,  MD Stress Interpreting Physician / Nuclear Interpreting Physician        Neoma Laming MD  Electronically signed by: Neoma Laming     Date: 07/10/2021 12:24  ASSESSMENT AND PLAN:    ICD-10-CM   1. SOB (shortness of breath)  R06.02 spironolactone (ALDACTONE) 25 MG tablet    2. Mitral valve prolapse  I34.1     3. Mixed hyperlipidemia  E78.2     4. Palpitations  R00.2        Problem List Items Addressed This Visit       Cardiovascular and Mediastinum   Mitral valve prolapse    Trace MR 06/2021      Relevant Medications   rosuvastatin (CRESTOR) 20 MG tablet   spironolactone (ALDACTONE) 25 MG tablet     Other   Hyperlipidemia    LDL 90 12/2021. Continue rosuvastatin.      Relevant Medications   rosuvastatin (CRESTOR) 20 MG tablet   spironolactone (ALDACTONE) 25 MG tablet   Palpitations    Controlled on atenolol.      Other Visit Diagnoses     SOB (shortness of breath)    -  Primary   Relevant Medications   spironolactone (ALDACTONE) 25 MG tablet        Disposition:   Return in  about 1 year (around 06/02/2023).    Total time spent: 30 minutes  Signed,  Neoma Laming, MD  06/01/2022 1:24 [place]

## 2022-06-01 NOTE — Assessment & Plan Note (Signed)
Trace MR 06/2021

## 2022-06-01 NOTE — Assessment & Plan Note (Signed)
Controlled on atenolol.

## 2022-06-01 NOTE — Assessment & Plan Note (Signed)
LDL 90 12/2021. Continue rosuvastatin.

## 2022-06-15 ENCOUNTER — Encounter: Payer: Self-pay | Admitting: Internal Medicine

## 2022-06-15 ENCOUNTER — Ambulatory Visit (INDEPENDENT_AMBULATORY_CARE_PROVIDER_SITE_OTHER): Payer: Medicare HMO | Admitting: Internal Medicine

## 2022-06-15 VITALS — BP 120/68 | HR 78 | Ht 60.0 in | Wt 138.0 lb

## 2022-06-15 DIAGNOSIS — I10 Essential (primary) hypertension: Secondary | ICD-10-CM | POA: Diagnosis not present

## 2022-06-15 DIAGNOSIS — Z1231 Encounter for screening mammogram for malignant neoplasm of breast: Secondary | ICD-10-CM

## 2022-06-15 DIAGNOSIS — E782 Mixed hyperlipidemia: Secondary | ICD-10-CM

## 2022-06-15 NOTE — Progress Notes (Signed)
Established Patient Office Visit  Subjective:  Patient ID: Alexa Navarro, female    DOB: 09-Dec-1955  Age: 67 y.o. MRN: [id number]  Chief Complaint  Patient presents with   Follow-up    5 month follow up    Patient comes in for her follow-up today.  She has been doing fine until recently when she went on a long drive to [place] and back and developed pain and stiffness in both her hips.  She does have history of bursitis.  However slowly she has started to improve and now is resuming her activities as well as some exercises. She is doing well otherwise.  Fasting for labs today.  Also needs to be scheduled for mammogram. Her last Normal colonoscopy was in May 2022.     History reviewed. No pertinent past medical history.  Social History   Socioeconomic History   Marital status: Married    Spouse name: Not on file   Number of children: Not on file   Years of education: Not on file   Highest education level: Not on file  Occupational History   Not on file  Tobacco Use   Smoking status: Never   Smokeless tobacco: Not on file  Substance and Sexual Activity   Alcohol use: No    Alcohol/week: 0.0 standard drinks of alcohol   Drug use: Not on file   Sexual activity: Not on file  Other Topics Concern   Not on file  Social History Narrative   Not on file   Social Determinants of Health   Financial Resource Strain: Not on file  Food Insecurity: Not on file  Transportation Needs: Not on file  Physical Activity: Not on file  Stress: Not on file  Social Connections: Not on file  Intimate Partner Violence: Not on file   Past Surgical History:  Procedure Laterality Date   APPENDECTOMY     BTL     Right Rotator cuff repair     TONSILLECTOMY Bilateral      Family History  Problem Relation Age of Onset   Breast cancer Paternal Grandmother 45    No Known Allergies  Review of Systems  Constitutional: Negative.   HENT: Negative.    Eyes: Negative.   Respiratory:  Negative.    Cardiovascular: Negative.   Gastrointestinal: Negative.   Genitourinary: Negative.   Musculoskeletal:  Positive for joint pain. Negative for back pain, falls, myalgias and neck pain.  Skin: Negative.   Neurological: Negative.   Endo/Heme/Allergies: Negative.   Psychiatric/Behavioral: Negative.         Objective:   BP 120/68   Pulse 78   Ht 5' (1.524 m)   Wt 138 lb (62.6 kg)   SpO2 96%   BMI 26.95 kg/m   Vitals:   06/15/22 0903  BP: 120/68  Pulse: 78  Height: 5' (1.524 m)  Weight: 138 lb (62.6 kg)  SpO2: 96%  BMI (Calculated): 26.95    Physical Exam Vitals and nursing note reviewed.  Constitutional:      Appearance: Normal appearance.  HENT:     Head: Normocephalic.  Cardiovascular:     Rate and Rhythm: Normal rate and regular rhythm.  Pulmonary:     Effort: Pulmonary effort is normal.     Breath sounds: Normal breath sounds.  Musculoskeletal:        General: Normal range of motion.     Cervical back: Normal range of motion and neck supple.  Skin:    General: Skin is  warm and dry.  Neurological:     General: No focal deficit present.     Mental Status: She is alert and oriented to person, place, and time.  Psychiatric:        Mood and Affect: Mood normal.        Behavior: Behavior normal.      No results found for any visits on 06/15/22.  No results found for this or any previous visit (from the past 2160 hour(s)).    Assessment & Plan:  Patient advised to do stretching exercises for her hip pain.  She can take Tylenol as needed.  Advised to limit her NSAID use.   Schedule mammogram. Problem List Items Addressed This Visit     Hyperlipidemia - Primary   Relevant Orders   Lipid Panel w/o Chol/HDL Ratio   Other Visit Diagnoses     Essential hypertension, benign       Relevant Orders   CMP14+EGFR   Encounter for screening mammogram for malignant neoplasm of breast       Relevant Orders   MM 3D SCREENING MAMMOGRAM BILATERAL BREAST        Return in about 4 months (around 10/15/2022).   Total time spent: 30 minutes  Perrin Maltese, MD  06/15/2022

## 2022-06-16 LAB — CMP14+EGFR
ALT: 25 IU/L (ref 0–32)
AST: 28 IU/L (ref 0–40)
Albumin/Globulin Ratio: 2.1 (ref 1.2–2.2)
Albumin: 4.4 g/dL (ref 3.9–4.9)
Alkaline Phosphatase: 59 IU/L (ref 44–121)
BUN/Creatinine Ratio: 22 (ref 12–28)
BUN: 23 mg/dL (ref 8–27)
Bilirubin Total: 0.4 mg/dL (ref 0.0–1.2)
CO2: 21 mmol/L (ref 20–29)
Calcium: 10.4 mg/dL — ABNORMAL HIGH (ref 8.7–10.3)
Chloride: 106 mmol/L (ref 96–106)
Creatinine, Ser: 1.05 mg/dL — ABNORMAL HIGH (ref 0.57–1.00)
Globulin, Total: 2.1 g/dL (ref 1.5–4.5)
Glucose: 83 mg/dL (ref 70–99)
Potassium: 5.5 mmol/L — ABNORMAL HIGH (ref 3.5–5.2)
Sodium: 141 mmol/L (ref 134–144)
Total Protein: 6.5 g/dL (ref 6.0–8.5)
eGFR: 58 mL/min/{1.73_m2} — ABNORMAL LOW (ref >59)

## 2022-06-16 LAB — LIPID PANEL W/O CHOL/HDL RATIO
Cholesterol, Total: 156 mg/dL (ref 100–199)
HDL: 53 mg/dL (ref >39)
LDL Chol Calc (NIH): 73 mg/dL (ref 0–99)
Triglycerides: 176 mg/dL — ABNORMAL HIGH (ref 0–149)
VLDL Cholesterol Cal: 30 mg/dL (ref 5–40)

## 2022-08-02 ENCOUNTER — Ambulatory Visit
Admission: RE | Admit: 2022-08-02 | Discharge: 2022-08-02 | Disposition: A | Discharge status: Home / Self Care | Payer: Medicare HMO | Source: Ambulatory Visit | Attending: Internal Medicine | Admitting: Internal Medicine

## 2022-08-02 DIAGNOSIS — Z1231 Encounter for screening mammogram for malignant neoplasm of breast: Secondary | ICD-10-CM | POA: Insufficient documentation

## 2022-08-06 ENCOUNTER — Other Ambulatory Visit: Payer: Self-pay | Admitting: Internal Medicine

## 2022-08-06 DIAGNOSIS — T753XXA Motion sickness, initial encounter: Secondary | ICD-10-CM

## 2022-10-18 ENCOUNTER — Encounter: Payer: Self-pay | Admitting: Internal Medicine

## 2022-10-18 ENCOUNTER — Ambulatory Visit (INDEPENDENT_AMBULATORY_CARE_PROVIDER_SITE_OTHER): Payer: Medicare HMO | Admitting: Internal Medicine

## 2022-10-18 VITALS — BP 122/80 | HR 65 | Ht 60.0 in | Wt 134.8 lb

## 2022-10-18 DIAGNOSIS — I10 Essential (primary) hypertension: Secondary | ICD-10-CM | POA: Diagnosis not present

## 2022-10-18 DIAGNOSIS — E782 Mixed hyperlipidemia: Secondary | ICD-10-CM | POA: Diagnosis not present

## 2022-10-18 DIAGNOSIS — Z13820 Encounter for screening for osteoporosis: Secondary | ICD-10-CM

## 2022-10-18 NOTE — Progress Notes (Signed)
Established Patient Office Visit  Subjective:  Patient ID: Alexa Navarro, female    DOB: 27-Jan-1956  Age: 67 y.o. MRN: [id number]  Chief Complaint  Patient presents with   Follow-up    4 month follow up    Patient comes in for her follow-up today.  She has been feeling well and is fasting for blood work.  Patient has been exercising regularly and controlling her diet.  She has no new complaints today.    No other concerns at this time.   Past Medical History:  Diagnosis Date   Allergic rhinitis due to pollen    Allergy    Chronic bilateral back pain    GERD (gastroesophageal reflux disease)    Hyperlipidemia    Hypertension    Kidney stones    Palpitations    Trochanteric bursitis of right hip     Past Surgical History:  Procedure Laterality Date   APPENDECTOMY     BTL     Right Rotator cuff repair     TONSILLECTOMY Bilateral     Social History   Socioeconomic History   Marital status: Married    Spouse name: Not on file   Number of children: Not on file   Years of education: Not on file   Highest education level: Not on file  Occupational History   Not on file  Tobacco Use   Smoking status: Never   Smokeless tobacco: Never  Substance and Sexual Activity   Alcohol use: No    Alcohol/week: 0.0 standard drinks of alcohol   Drug use: Not on file   Sexual activity: Not on file  Other Topics Concern   Not on file  Social History Narrative   Not on file   Social Determinants of Health   Financial Resource Strain: Not on file  Food Insecurity: Not on file  Transportation Needs: Not on file  Physical Activity: Not on file  Stress: Not on file  Social Connections: Not on file  Intimate Partner Violence: Not on file    Family History  Problem Relation Age of Onset   Coronary artery disease Mother    Breast cancer Paternal Grandmother 12    No Known Allergies  Review of Systems  Constitutional: Negative.  Negative for chills, fever,  malaise/fatigue and weight loss.  HENT: Negative.  Negative for congestion, ear discharge, sinus pain and sore throat.   Eyes: Negative.   Respiratory: Negative.  Negative for cough and shortness of breath.   Cardiovascular: Negative.  Negative for chest pain, palpitations and leg swelling.  Gastrointestinal: Negative.  Negative for abdominal pain, constipation, diarrhea, heartburn, nausea and vomiting.  Genitourinary: Negative.  Negative for dysuria and flank pain.  Musculoskeletal: Negative.  Negative for joint pain and myalgias.  Skin: Negative.   Neurological: Negative.  Negative for dizziness and headaches.  Endo/Heme/Allergies: Negative.   Psychiatric/Behavioral: Negative.  Negative for depression and suicidal ideas. The patient is not nervous/anxious.        Objective:   BP 122/80   Pulse 65   Ht 5' (1.524 m)   Wt 134 lb 12.8 oz (61.1 kg)   SpO2 98%   BMI 26.33 kg/m   Vitals:   10/18/22 0922  BP: 122/80  Pulse: 65  Height: 5' (1.524 m)  Weight: 134 lb 12.8 oz (61.1 kg)  SpO2: 98%  BMI (Calculated): 26.33    Physical Exam Vitals and nursing note reviewed.  Constitutional:      Appearance: Normal appearance.  HENT:     Head: Normocephalic and atraumatic.     Nose: Nose normal.     Mouth/Throat:     Mouth: Mucous membranes are moist.     Pharynx: Oropharynx is clear.  Eyes:     Conjunctiva/sclera: Conjunctivae normal.     Pupils: Pupils are equal, round, and reactive to light.  Cardiovascular:     Rate and Rhythm: Normal rate and regular rhythm.     Pulses: Normal pulses.     Heart sounds: Normal heart sounds. No murmur heard. Pulmonary:     Effort: Pulmonary effort is normal.     Breath sounds: Normal breath sounds. No wheezing.  Abdominal:     General: Bowel sounds are normal.     Palpations: Abdomen is soft.     Tenderness: There is no abdominal tenderness. There is no right CVA tenderness or left CVA tenderness.  Musculoskeletal:        General:  Normal range of motion.     Cervical back: Normal range of motion.     Right lower leg: No edema.     Left lower leg: No edema.  Skin:    General: Skin is warm and dry.  Neurological:     General: No focal deficit present.     Mental Status: She is alert and oriented to person, place, and time.  Psychiatric:        Mood and Affect: Mood normal.        Behavior: Behavior normal.      No results found for any visits on 10/18/22.  No results found for this or any previous visit (from the past 2160 hour(s)).    Assessment & Plan:  Patient will continue taking her medications.  Lab work today. Problem List Items Addressed This Visit     Hyperlipidemia - Primary   Relevant Orders   Lipid Panel w/o Chol/HDL Ratio   Other Visit Diagnoses     Essential hypertension, benign       Relevant Orders   CMP14+EGFR   Screening for osteoporosis       Relevant Orders   DG Bone Density       Return in about 6 months (around 04/20/2023).   Total time spent: 25 minutes  Margaretann Loveless, MD  10/18/2022   This document may have been prepared by [facility] Voice Recognition software and as such may include unintentional dictation errors.

## 2022-10-19 LAB — CMP14+EGFR
ALT: 30 IU/L (ref 0–32)
AST: 28 IU/L (ref 0–40)
Albumin: 4.4 g/dL (ref 3.9–4.9)
Alkaline Phosphatase: 64 IU/L (ref 44–121)
BUN/Creatinine Ratio: 19 (ref 12–28)
BUN: 20 mg/dL (ref 8–27)
Bilirubin Total: 0.4 mg/dL (ref 0.0–1.2)
CO2: 24 mmol/L (ref 20–29)
Calcium: 10.4 mg/dL — ABNORMAL HIGH (ref 8.7–10.3)
Chloride: 104 mmol/L (ref 96–106)
Creatinine, Ser: 1.04 mg/dL — ABNORMAL HIGH (ref 0.57–1.00)
Globulin, Total: 2 g/dL (ref 1.5–4.5)
Glucose: 81 mg/dL (ref 70–99)
Potassium: 5 mmol/L (ref 3.5–5.2)
Sodium: 142 mmol/L (ref 134–144)
Total Protein: 6.4 g/dL (ref 6.0–8.5)
eGFR: 59 mL/min/{1.73_m2} — ABNORMAL LOW (ref >59)

## 2022-10-19 LAB — LIPID PANEL W/O CHOL/HDL RATIO
Cholesterol, Total: 169 mg/dL (ref 100–199)
HDL: 48 mg/dL (ref >39)
LDL Chol Calc (NIH): 89 mg/dL (ref 0–99)
Triglycerides: 189 mg/dL — ABNORMAL HIGH (ref 0–149)
VLDL Cholesterol Cal: 32 mg/dL (ref 5–40)

## 2022-10-19 NOTE — Progress Notes (Signed)
Patient notified

## 2022-10-26 ENCOUNTER — Ambulatory Visit (INDEPENDENT_AMBULATORY_CARE_PROVIDER_SITE_OTHER): Payer: Medicare HMO

## 2022-10-26 DIAGNOSIS — Z13820 Encounter for screening for osteoporosis: Secondary | ICD-10-CM | POA: Diagnosis not present

## 2022-10-26 DIAGNOSIS — M8589 Other specified disorders of bone density and structure, multiple sites: Secondary | ICD-10-CM | POA: Diagnosis not present

## 2022-10-27 NOTE — Progress Notes (Signed)
Spoke with pt verbalized understanding 

## 2023-02-01 ENCOUNTER — Other Ambulatory Visit: Payer: Self-pay | Admitting: Family

## 2023-02-01 ENCOUNTER — Other Ambulatory Visit: Payer: Self-pay | Admitting: Internal Medicine

## 2023-03-21 DIAGNOSIS — H524 Presbyopia: Secondary | ICD-10-CM | POA: Diagnosis not present

## 2023-04-21 ENCOUNTER — Ambulatory Visit (INDEPENDENT_AMBULATORY_CARE_PROVIDER_SITE_OTHER): Payer: Medicare HMO | Admitting: Internal Medicine

## 2023-04-21 ENCOUNTER — Encounter: Payer: Self-pay | Admitting: Internal Medicine

## 2023-04-21 VITALS — BP 102/64 | HR 67 | Ht 60.0 in | Wt 138.8 lb

## 2023-04-21 DIAGNOSIS — R0602 Shortness of breath: Secondary | ICD-10-CM

## 2023-04-21 DIAGNOSIS — Z0001 Encounter for general adult medical examination with abnormal findings: Secondary | ICD-10-CM | POA: Diagnosis not present

## 2023-04-21 DIAGNOSIS — R002 Palpitations: Secondary | ICD-10-CM | POA: Diagnosis not present

## 2023-04-21 DIAGNOSIS — E782 Mixed hyperlipidemia: Secondary | ICD-10-CM

## 2023-04-21 DIAGNOSIS — I10 Essential (primary) hypertension: Secondary | ICD-10-CM

## 2023-04-21 DIAGNOSIS — Z0000 Encounter for general adult medical examination without abnormal findings: Secondary | ICD-10-CM

## 2023-04-21 DIAGNOSIS — T753XXA Motion sickness, initial encounter: Secondary | ICD-10-CM

## 2023-04-21 MED ORDER — SCOPOLAMINE 1 MG/3DAYS TD PT72: 1.0000 | MEDICATED_PATCH | TRANSDERMAL | 3 refills | Status: AC

## 2023-04-21 MED ORDER — ATENOLOL 25 MG PO TABS: 25.0000 mg | ORAL_TABLET | Freq: Every day | ORAL | 3 refills | Status: DC

## 2023-04-21 MED ORDER — SPIRONOLACTONE 25 MG PO TABS: 25.0000 mg | ORAL_TABLET | Freq: Every day | ORAL | 3 refills | Status: DC

## 2023-04-21 MED ORDER — ROSUVASTATIN CALCIUM 20 MG PO TABS: 20.0000 mg | ORAL_TABLET | Freq: Every day | ORAL | 3 refills | Status: DC

## 2023-04-21 MED ORDER — ALENDRONATE SODIUM 70 MG PO TABS: 70.0000 mg | ORAL_TABLET | ORAL | 3 refills | Status: AC

## 2023-04-21 NOTE — Progress Notes (Signed)
 Established Patient Office Visit  Subjective:  Patient ID: Alexa Navarro, female    DOB: 12-Jul-1955  Age: 68 y.o. MRN: [id number]  Chief Complaint  Patient presents with   Follow-up    6 month follow up    Patient comes in for her follow-up and annual wellness visit.  She is generally feeling well and has no new complaints.  Her mammogram, BMD, colonoscopy are up-to-date. PHQ-9/GAD score is 0. CFS score is 0. She is fasting for blood work and needs refills..    No other concerns at this time.   Past Medical History:  Diagnosis Date   Allergic rhinitis due to pollen    Allergy    Chronic bilateral back pain    GERD (gastroesophageal reflux disease)    Hyperlipidemia    Hypertension    Kidney stones    Palpitations    Trochanteric bursitis of right hip     Past Surgical History:  Procedure Laterality Date   APPENDECTOMY     BTL     Right Rotator cuff repair     TONSILLECTOMY Bilateral     Social History   Socioeconomic History   Marital status: Married    Spouse name: Not on file   Number of children: Not on file   Years of education: Not on file   Highest education level: Not on file  Occupational History   Not on file  Tobacco Use   Smoking status: Never   Smokeless tobacco: Never  Substance and Sexual Activity   Alcohol use: No    Alcohol/week: 0.0 standard drinks of alcohol   Drug use: Not on file   Sexual activity: Not on file  Other Topics Concern   Not on file  Social History Narrative   Not on file   Social Drivers of Health   Financial Resource Strain: Not on file  Food Insecurity: No Food Insecurity (04/21/2023)   Hunger Vital Sign    Worried About Running Out of Food in the Last Year: Never true    Ran Out of Food in the Last Year: Never true  Transportation Needs: No Transportation Needs (04/21/2023)   PRAPARE - Administrator, Civil Service (Medical): No    Lack of Transportation (Non-Medical): No  Physical Activity: Not  on file  Stress: No Stress Concern Present (04/21/2023)   [employer] of Occupational Health - Occupational Stress Questionnaire    Feeling of Stress : Not at all  Social Connections: Not on file  Intimate Partner Violence: Not At Risk (04/21/2023)   Humiliation, Afraid, Rape, and Kick questionnaire    Fear of Current or Ex-Partner: No    Emotionally Abused: No    Physically Abused: No    Sexually Abused: No    Family History  Problem Relation Age of Onset   Coronary artery disease Mother    Breast cancer Paternal Grandmother 60    No Known Allergies  Outpatient Medications Prior to Visit  Medication Sig   DiphenhydrAMINE HCl (ALLERGY MED PO) Take by mouth.   [DISCONTINUED] alendronate  (FOSAMAX ) 70 MG tablet TAKE 1 TABLET BY MOUTH ONE TIME PER WEEK   [DISCONTINUED] atenolol  (TENORMIN ) 25 MG tablet TAKE 1 TABLET BY MOUTH EVERY DAY   [DISCONTINUED] rosuvastatin  (CRESTOR ) 20 MG tablet Take 20 mg by mouth daily.   [DISCONTINUED] spironolactone  (ALDACTONE ) 25 MG tablet Take 1 tablet (25 mg total) by mouth daily.   [DISCONTINUED] scopolamine  (TRANSDERM-SCOP) 1 MG/3DAYS USE 1 PATCH EVERY 72 HOURS .  START 4 HOURS BEFORE DEPARTURE. (Patient not taking: Reported on 04/21/2023)   No facility-administered medications prior to visit.    Review of Systems  Constitutional: Negative.  Negative for chills, fever and weight loss.  HENT: Negative.  Negative for congestion and ear discharge.   Eyes: Negative.   Respiratory: Negative.  Negative for cough, shortness of breath and stridor.   Cardiovascular: Negative.  Negative for chest pain, palpitations and leg swelling.  Gastrointestinal: Negative.  Negative for abdominal pain, constipation, diarrhea, heartburn, nausea and vomiting.  Genitourinary: Negative.  Negative for dysuria and flank pain.  Musculoskeletal: Negative.  Negative for joint pain and myalgias.  Skin: Negative.   Neurological: Negative.  Negative for dizziness, tingling,  tremors and headaches.  Endo/Heme/Allergies: Negative.   Psychiatric/Behavioral: Negative.  Negative for depression and suicidal ideas. The patient is not nervous/anxious.        Objective:   BP 102/64   Pulse 67   Ht 5' (1.524 m)   Wt 138 lb 12.8 oz (63 kg)   SpO2 98%   BMI 27.11 kg/m   Vitals:   04/21/23 0854  BP: 102/64  Pulse: 67  Height: 5' (1.524 m)  Weight: 138 lb 12.8 oz (63 kg)  SpO2: 98%  BMI (Calculated): 27.11    Physical Exam Vitals and nursing note reviewed.  Constitutional:      General: She is not in acute distress.    Appearance: Normal appearance.  HENT:     Head: Normocephalic and atraumatic.     Nose: Nose normal.     Mouth/Throat:     Mouth: Mucous membranes are moist.     Pharynx: Oropharynx is clear.  Eyes:     Conjunctiva/sclera: Conjunctivae normal.     Pupils: Pupils are equal, round, and reactive to light.  Cardiovascular:     Rate and Rhythm: Normal rate and regular rhythm.     Pulses: Normal pulses.     Heart sounds: Normal heart sounds. No murmur heard. Pulmonary:     Effort: Pulmonary effort is normal.     Breath sounds: Normal breath sounds. No wheezing.  Abdominal:     General: Bowel sounds are normal.     Palpations: Abdomen is soft.     Tenderness: There is no abdominal tenderness. There is no right CVA tenderness or left CVA tenderness.  Musculoskeletal:        General: No swelling. Normal range of motion.     Cervical back: Normal range of motion.     Right lower leg: No edema.     Left lower leg: No edema.  Skin:    General: Skin is warm and dry.  Neurological:     General: No focal deficit present.     Mental Status: She is alert and oriented to person, place, and time.  Psychiatric:        Mood and Affect: Mood normal.        Behavior: Behavior normal.      No results found for any visits on 04/21/23.  No results found for this or any previous visit (from the past 2160 hours).    Assessment & Plan:   Continue current medications.  Check labs. Problem List Items Addressed This Visit     Hyperlipidemia   Relevant Medications   atenolol  (TENORMIN ) 25 MG tablet   spironolactone  (ALDACTONE ) 25 MG tablet   rosuvastatin  (CRESTOR ) 20 MG tablet   Other Relevant Orders   CMP14+EGFR   Lipid Panel w/o Chol/HDL  Ratio   Palpitations   Relevant Medications   atenolol  (TENORMIN ) 25 MG tablet   Other Visit Diagnoses       Medicare annual wellness visit, subsequent    -  Primary     Sea sickness, initial encounter       Relevant Medications   scopolamine  (TRANSDERM-SCOP) 1 MG/3DAYS     Essential hypertension, benign       Relevant Medications   atenolol  (TENORMIN ) 25 MG tablet   spironolactone  (ALDACTONE ) 25 MG tablet   rosuvastatin  (CRESTOR ) 20 MG tablet     SOB (shortness of breath)       Relevant Medications   spironolactone  (ALDACTONE ) 25 MG tablet       Return in about 6 months (around 10/19/2023).   Total time spent: 30 minutes  FERNAND FREDY RAMAN, MD  04/21/2023   This document may have been prepared by [facility] Voice Recognition software and as such may include unintentional dictation errors.

## 2023-04-22 LAB — CMP14+EGFR
ALT: 29 [IU]/L (ref 0–32)
AST: 31 [IU]/L (ref 0–40)
Albumin: 4.4 g/dL (ref 3.9–4.9)
Alkaline Phosphatase: 62 [IU]/L (ref 44–121)
BUN/Creatinine Ratio: 20 (ref 12–28)
BUN: 23 mg/dL (ref 8–27)
Bilirubin Total: 0.6 mg/dL (ref 0.0–1.2)
CO2: 24 mmol/L (ref 20–29)
Calcium: 10.4 mg/dL — ABNORMAL HIGH (ref 8.7–10.3)
Chloride: 102 mmol/L (ref 96–106)
Creatinine, Ser: 1.16 mg/dL — ABNORMAL HIGH (ref 0.57–1.00)
Globulin, Total: 2.3 g/dL (ref 1.5–4.5)
Glucose: 82 mg/dL (ref 70–99)
Potassium: 5.3 mmol/L — ABNORMAL HIGH (ref 3.5–5.2)
Sodium: 140 mmol/L (ref 134–144)
Total Protein: 6.7 g/dL (ref 6.0–8.5)
eGFR: 51 mL/min/{1.73_m2} — ABNORMAL LOW (ref >59)

## 2023-04-22 LAB — LIPID PANEL W/O CHOL/HDL RATIO
Cholesterol, Total: 147 mg/dL (ref 100–199)
HDL: 45 mg/dL (ref >39)
LDL Chol Calc (NIH): 80 mg/dL (ref 0–99)
Triglycerides: 123 mg/dL (ref 0–149)
VLDL Cholesterol Cal: 22 mg/dL (ref 5–40)

## 2023-04-22 NOTE — Progress Notes (Signed)
 Informed via Mychart message

## 2023-05-10 DIAGNOSIS — D225 Melanocytic nevi of trunk: Secondary | ICD-10-CM | POA: Diagnosis not present

## 2023-05-10 DIAGNOSIS — L6612 Frontal fibrosing alopecia: Secondary | ICD-10-CM | POA: Diagnosis not present

## 2023-05-10 DIAGNOSIS — D2262 Melanocytic nevi of left upper limb, including shoulder: Secondary | ICD-10-CM | POA: Diagnosis not present

## 2023-05-10 DIAGNOSIS — D2271 Melanocytic nevi of right lower limb, including hip: Secondary | ICD-10-CM | POA: Diagnosis not present

## 2023-05-10 DIAGNOSIS — D2261 Melanocytic nevi of right upper limb, including shoulder: Secondary | ICD-10-CM | POA: Diagnosis not present

## 2023-05-10 DIAGNOSIS — L821 Other seborrheic keratosis: Secondary | ICD-10-CM | POA: Diagnosis not present

## 2023-05-10 DIAGNOSIS — D2272 Melanocytic nevi of left lower limb, including hip: Secondary | ICD-10-CM | POA: Diagnosis not present

## 2023-07-12 IMAGING — MG MM DIGITAL SCREENING BILAT W/ TOMO AND CAD
8 series · 8 of 24 positions shown · non-contrast
Comparison: Previous exam(s).

CLINICAL DATA: Screening.

EXAM:
DIGITAL SCREENING BILATERAL MAMMOGRAM WITH TOMOSYNTHESIS AND CAD
TECHNIQUE: Bilateral screening digital craniocaudal and mediolateral oblique
mammograms were obtained. Bilateral screening digital breast
tomosynthesis was performed. The images were evaluated with
computer-aided detection.

[L CC synth-2D]
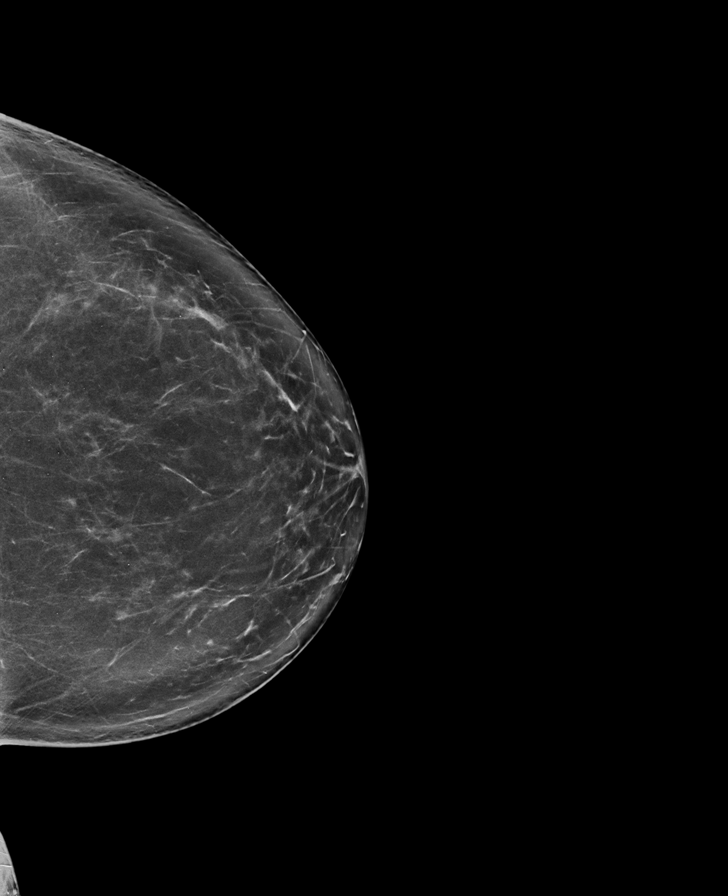

[R CC synth-2D]
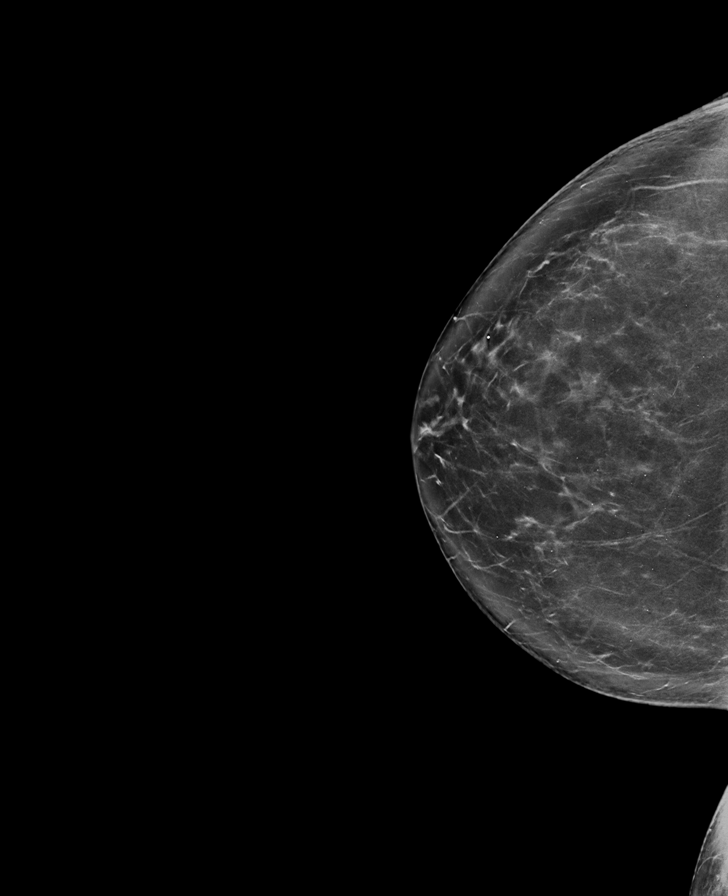

[L MLO synth-2D]
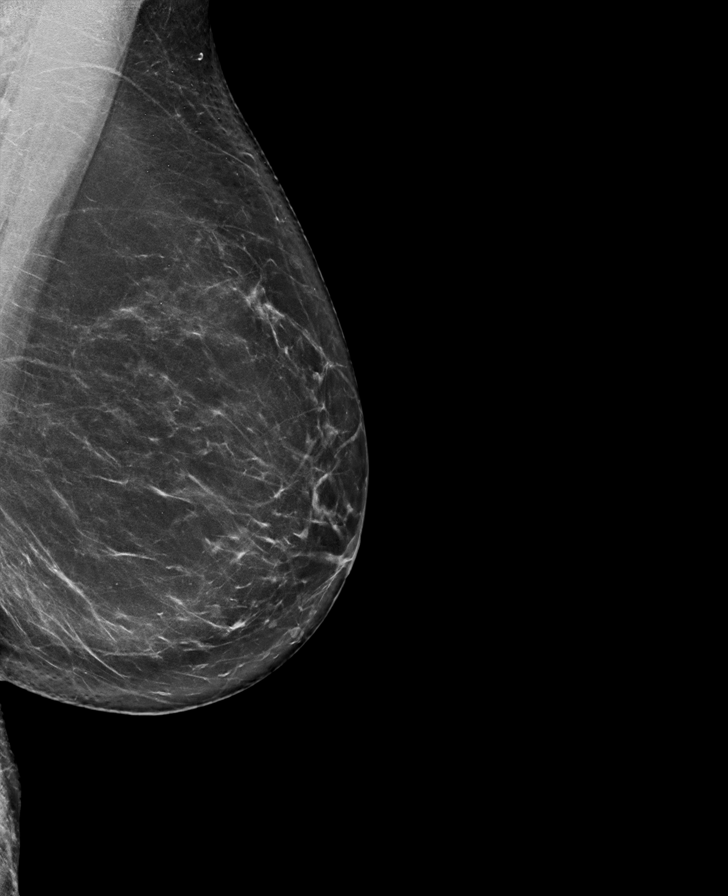

[R MLO synth-2D]
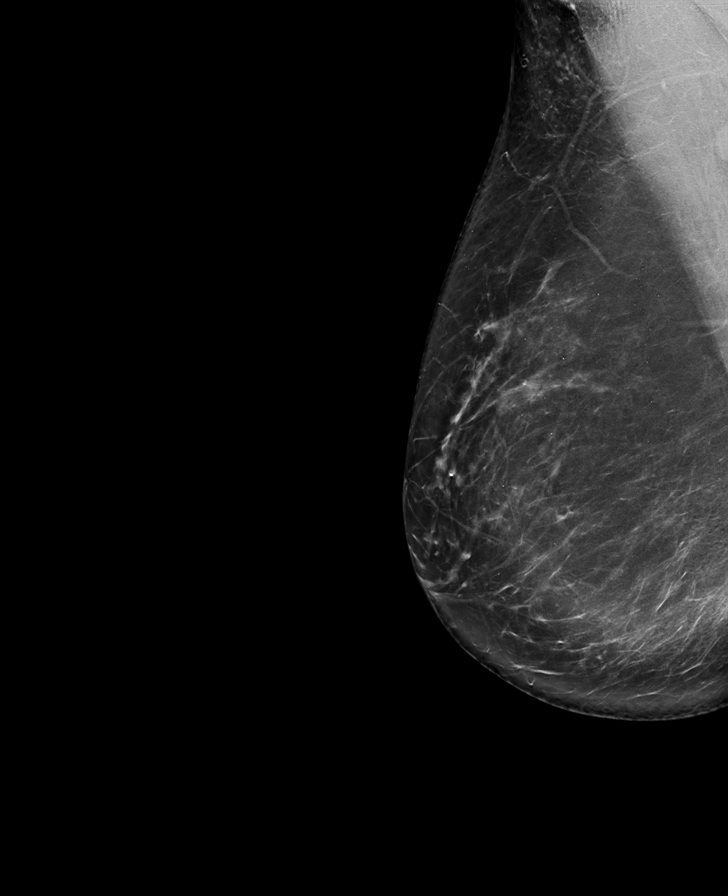

[R CC tomo · tomo slice 42/83.0]
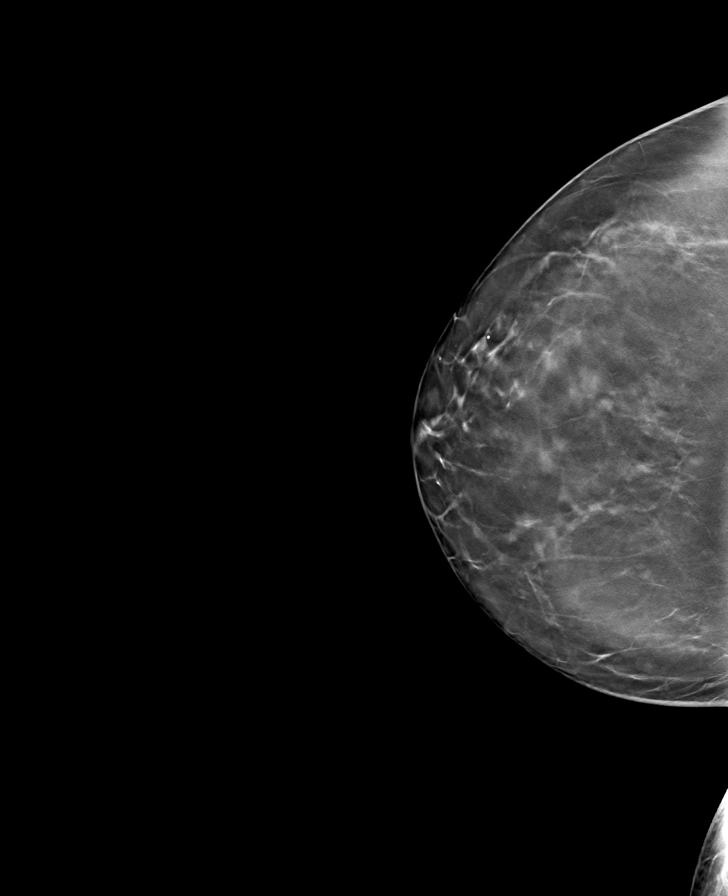

[R MLO tomo · tomo slice 43/86.0]
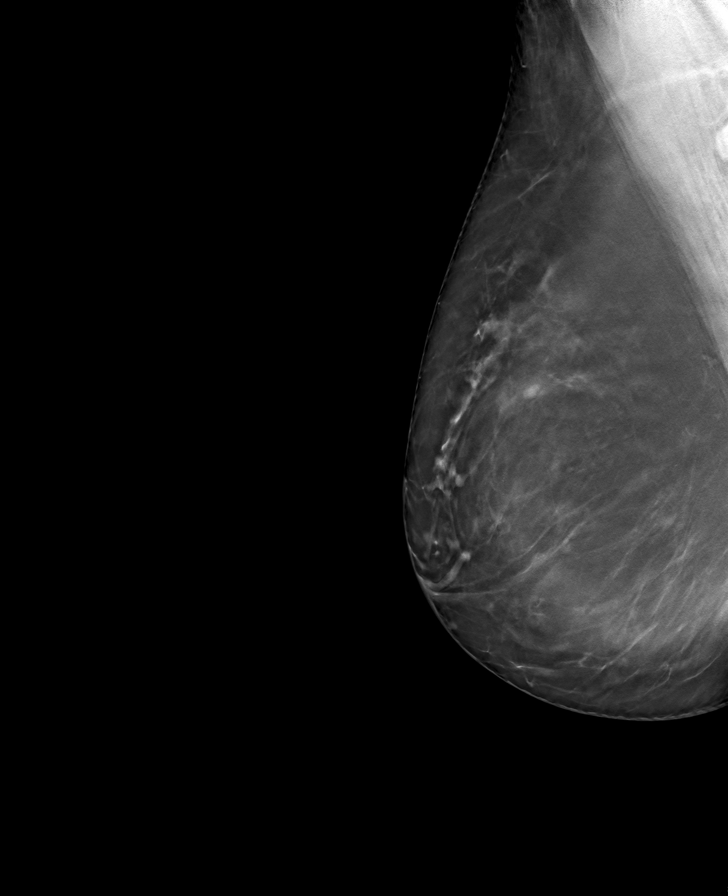

[L MLO tomo · tomo slice 42/83.0]
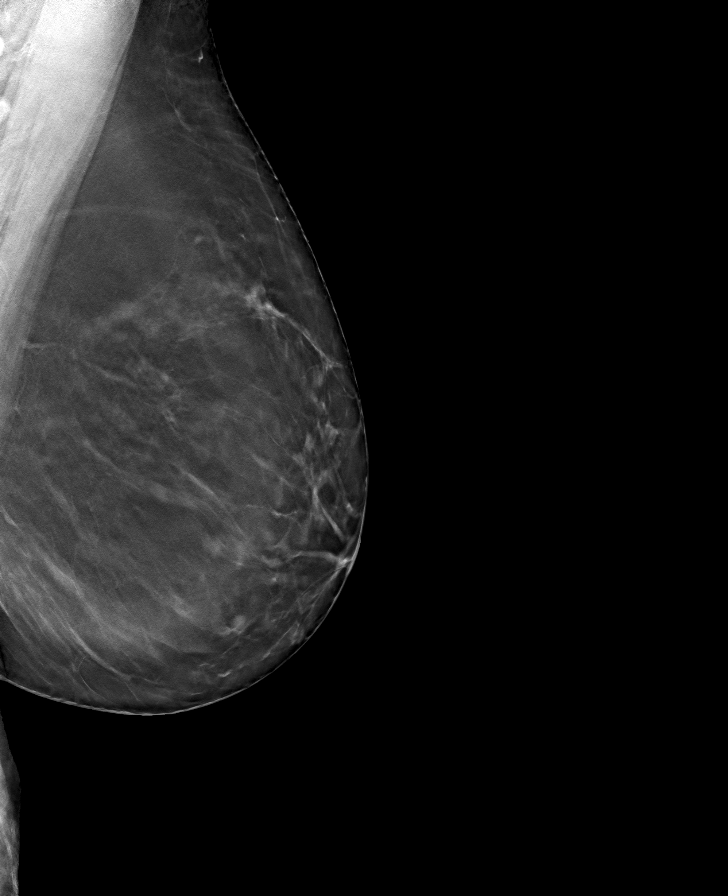

[L CC tomo · tomo slice 41/82.0]
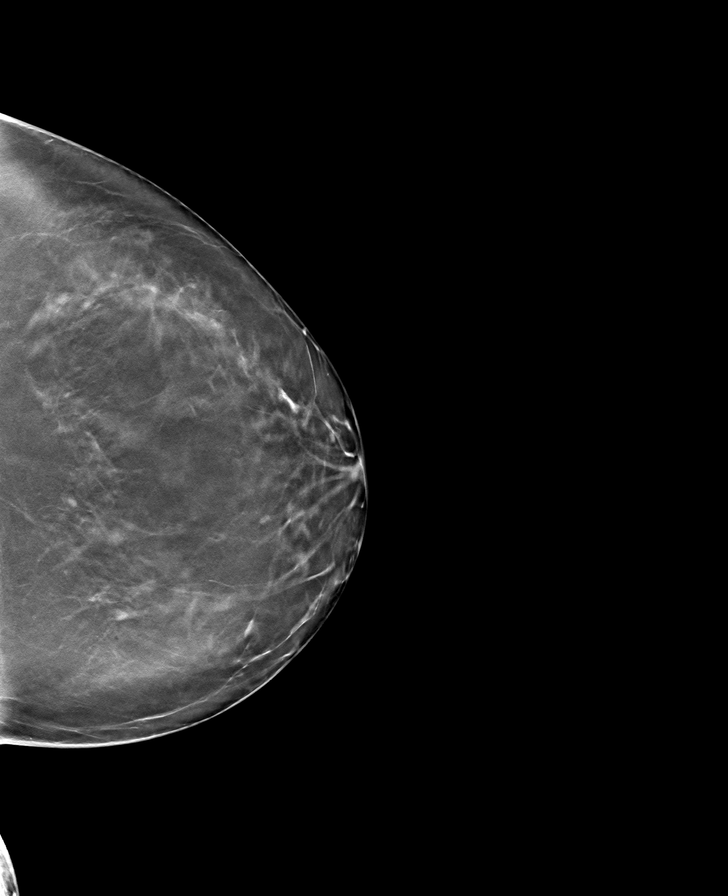

[8 of 24 positions shown; findings below may reference images not displayed]

ACR Breast Density Category b: There are scattered areas of
fibroglandular density.
FINDINGS: There are no findings suspicious for malignancy.
IMPRESSION: No mammographic evidence of malignancy. A result letter of this
screening mammogram will be mailed directly to the patient.

RECOMMENDATION:
Screening mammogram in one year. (Code:51-O-LD2)

BI-RADS CATEGORY  1: Negative.

## 2023-07-27 ENCOUNTER — Encounter: Payer: Self-pay | Admitting: Emergency Medicine

## 2023-07-27 ENCOUNTER — Ambulatory Visit: Admission: EM | Admit: 2023-07-27 | Discharge: 2023-07-27 | Disposition: A | Discharge status: Home / Self Care

## 2023-07-27 ENCOUNTER — Other Ambulatory Visit: Payer: Self-pay

## 2023-07-27 DIAGNOSIS — S39011A Strain of muscle, fascia and tendon of abdomen, initial encounter: Secondary | ICD-10-CM

## 2023-07-27 LAB — POCT URINALYSIS DIP (MANUAL ENTRY)
Bilirubin, UA: NEGATIVE
Blood, UA: NEGATIVE
Glucose, UA: NEGATIVE mg/dL
Ketones, POC UA: NEGATIVE mg/dL
Leukocytes, UA: NEGATIVE
Nitrite, UA: NEGATIVE
Protein Ur, POC: NEGATIVE mg/dL
Spec Grav, UA: 1.015
Urobilinogen, UA: 0.2 U/dL
pH, UA: 6

## 2023-07-27 NOTE — Discharge Instructions (Addendum)
 Strain of abdominal muscle, initial encounter - Continue taking ibuprofen as needed for muscle strain to lower abdomen. -Urinalysis performed in UC shows no leukocytes, no nitrite, no blood, no sign of urinary tract infection as a cause of symptoms -Limit heavy lifting or strenuous physical activity as this could exacerbate muscle strain. -Apply ice 2-3 times a day for 10 to 15 minutes at a time directly to the area of discomfort to help with inflammation and pain -Continue to monitor symptoms for any change in severity if there is any escalation of current symptoms or development of new symptoms follow-up in ER for further evaluation and management.

## 2023-07-27 NOTE — ED Triage Notes (Signed)
 Patient presents to [facility] for evaluation of mid lower abdominal pain since yesterday.  Says she has a hx of UTIs, but can also get similar pain after she has done heavy lifting as well.  She usually will trial ibuprofen for a few days, but she is leaving for a cruise on Saturday and wanted to r/o a UTI as soon as possible.  Denies dysuria "yet", denies back pain.  States sometimes sharp LLQ pain that stabs and releases, but nothing constant

## 2023-07-27 NOTE — ED Provider Notes (Signed)
 [facility]  Note:  This document was prepared using Conservation [employer], historic buildings and may include unintentional dictation errors.  MRN: [id number] DOB: [birth year]  Subjective:   Alexa Navarro is a 68 y.o. female presenting for bilateral lower abdominal pressure, no significant pain, no dysuria, no increased urinary frequency.  Patient was concern for possible UTI.  She states that she is about to go on a cruise and wanted to make sure she did not have a UTI before leaving.  Patient reports that sometimes when she lifts something heavy she gets pain in the lower abdomen that mimics UTI symptoms.  Patient denies any other medical concerns at this time.  No current facility-administered medications for this encounter.  Current Outpatient Medications:    alendronate (FOSAMAX) 70 MG tablet, Take 1 tablet (70 mg total) by mouth once a week. Take with a full glass of water on an empty stomach., Disp: 12 tablet, Rfl: 3   atenolol (TENORMIN) 25 MG tablet, Take 1 tablet (25 mg total) by mouth daily., Disp: 90 tablet, Rfl: 3   DiphenhydrAMINE HCl (ALLERGY MED PO), Take by mouth., Disp: , Rfl:    rosuvastatin (CRESTOR) 20 MG tablet, Take 1 tablet (20 mg total) by mouth daily., Disp: 90 tablet, Rfl: 3   scopolamine (TRANSDERM-SCOP) 1 MG/3DAYS, Place 1 patch (1.5 mg total) onto the skin every 3 (three) days., Disp: 6 patch, Rfl: 3   spironolactone (ALDACTONE) 25 MG tablet, Take 1 tablet (25 mg total) by mouth daily., Disp: 90 tablet, Rfl: 3   No Known Allergies  Past Medical History:  Diagnosis Date   Allergic rhinitis due to pollen    Allergy    Chronic bilateral back pain    GERD (gastroesophageal reflux disease)    Hyperlipidemia    Hypertension    Kidney stones    Palpitations    Trochanteric bursitis of right hip      Past Surgical History:  Procedure Laterality Date   APPENDECTOMY     BTL     Right Rotator cuff repair     TONSILLECTOMY Bilateral     Family  History  Problem Relation Age of Onset   Coronary artery disease Mother    Breast cancer Paternal Grandmother 73    Social History   Tobacco Use   Smoking status: Never   Smokeless tobacco: Never  Substance Use Topics   Alcohol use: No    Alcohol/week: 0.0 standard drinks of alcohol    ROS Refer to HPI for ROS details.  Objective:   Vitals: BP 120/79 (BP Location: Left Arm)   Pulse 82   Temp 98.2 F (36.8 C) (Oral)   Resp 18   SpO2 96%   Physical Exam Vitals and nursing note reviewed.  Constitutional:      General: She is not in acute distress.    Appearance: She is well-developed. She is not ill-appearing or toxic-appearing.  HENT:     Head: Normocephalic and atraumatic.     Mouth/Throat:     Mouth: Mucous membranes are moist.  Cardiovascular:     Rate and Rhythm: Normal rate.  Pulmonary:     Effort: Pulmonary effort is normal. No respiratory distress.  Abdominal:     Palpations: Abdomen is soft.     Tenderness: There is no abdominal tenderness. There is no right CVA tenderness, left CVA tenderness, guarding or rebound.     Hernia: No hernia is present.  Musculoskeletal:        General:  Normal range of motion.  Skin:    General: Skin is warm and dry.  Neurological:     General: No focal deficit present.     Mental Status: She is alert and oriented to person, place, and time.  Psychiatric:        Mood and Affect: Mood normal.        Behavior: Behavior normal.     Procedures  Results for orders placed or performed during the hospital encounter of 07/27/23 (from the past 24 hours)  POCT urinalysis dipstick     Status: Normal   Collection Time: 07/27/23 12:09 PM  Result Value Ref Range   Color, UA yellow    Clarity, UA clear    Glucose, UA negative mg/dL   Bilirubin, UA negative    Ketones, POC UA negative mg/dL   Spec Grav, UA 1.610    Blood, UA negative    pH, UA 6.0    Protein Ur, POC negative mg/dL   Urobilinogen, UA 0.2 E.U./dL   Nitrite,  UA Negative    Leukocytes, UA Negative     No results found.   Assessment and Plan :     Discharge Instructions      Strain of abdominal muscle, initial encounter - Continue taking ibuprofen as needed for muscle strain to lower abdomen. -Urinalysis performed in UC shows no leukocytes, no nitrite, no blood, no sign of urinary tract infection as a cause of symptoms -Limit heavy lifting or strenuous physical activity as this could exacerbate muscle strain. -Apply ice 2-3 times a day for 10 to 15 minutes at a time directly to the area of discomfort to help with inflammation and pain -Continue to monitor symptoms for any change in severity if there is any escalation of current symptoms or development of new symptoms follow-up in ER for further evaluation and management.       Sheenah Dimitroff B Jonetta Dagley   Orlen Leedy, [place], [place] 07/27/23 1218

## 2023-08-09 ENCOUNTER — Other Ambulatory Visit: Payer: Self-pay | Admitting: Internal Medicine

## 2023-08-09 DIAGNOSIS — Z1231 Encounter for screening mammogram for malignant neoplasm of breast: Secondary | ICD-10-CM

## 2023-08-16 ENCOUNTER — Ambulatory Visit
Admission: RE | Admit: 2023-08-16 | Discharge: 2023-08-16 | Disposition: A | Discharge status: Home / Self Care | Source: Ambulatory Visit | Attending: Internal Medicine | Admitting: Internal Medicine

## 2023-08-16 DIAGNOSIS — Z1231 Encounter for screening mammogram for malignant neoplasm of breast: Secondary | ICD-10-CM | POA: Diagnosis not present

## 2023-10-20 ENCOUNTER — Ambulatory Visit: Payer: Medicare HMO | Admitting: Internal Medicine

## 2023-10-25 ENCOUNTER — Ambulatory Visit (INDEPENDENT_AMBULATORY_CARE_PROVIDER_SITE_OTHER): Admitting: Internal Medicine

## 2023-10-25 ENCOUNTER — Encounter: Payer: Self-pay | Admitting: Internal Medicine

## 2023-10-25 VITALS — BP 104/76 | HR 65 | Ht 60.0 in | Wt 140.4 lb

## 2023-10-25 DIAGNOSIS — I10 Essential (primary) hypertension: Secondary | ICD-10-CM

## 2023-10-25 DIAGNOSIS — E782 Mixed hyperlipidemia: Secondary | ICD-10-CM

## 2023-10-25 NOTE — Progress Notes (Signed)
 Established Patient Office Visit  Subjective:  Patient ID: Alexa Navarro, female    DOB: [birth year]  Age: 68 y.o. MRN: [id number]  Chief Complaint  Patient presents with   Follow-up    6 month follow up    Patient comes in for her follow-up today.  She is generally feeling well and has no new complaints.  She is fasting for blood work.  Exercising regularly and controls her diet as well. Up to date on mammogram and DEXA scan. Next colonoscopy due in 2027.    No other concerns at this time.   Past Medical History:  Diagnosis Date   Allergic rhinitis due to pollen    Allergy    Chronic bilateral back pain    GERD (gastroesophageal reflux disease)    Hyperlipidemia    Hypertension    Kidney stones    Palpitations    Trochanteric bursitis of right hip     Past Surgical History:  Procedure Laterality Date   APPENDECTOMY     BTL     Right Rotator cuff repair     TONSILLECTOMY Bilateral     Social History   Socioeconomic History   Marital status: Married    Spouse name: Not on file   Number of children: Not on file   Years of education: Not on file   Highest education level: Not on file  Occupational History   Not on file  Tobacco Use   Smoking status: Never   Smokeless tobacco: Never  Substance and Sexual Activity   Alcohol use: No    Alcohol/week: 0.0 standard drinks of alcohol   Drug use: Not on file   Sexual activity: Not on file  Other Topics Concern   Not on file  Social History Narrative   Not on file   Social Drivers of Health   Financial Resource Strain: Not on file  Food Insecurity: No Food Insecurity (04/21/2023)   Hunger Vital Sign    Worried About Running Out of Food in the Last Year: Never true    Ran Out of Food in the Last Year: Never true  Transportation Needs: No Transportation Needs (04/21/2023)   PRAPARE - Administrator, Civil Service (Medical): No    Lack of Transportation (Non-Medical): No  Physical Activity: Not on  file  Stress: No Stress Concern Present (04/21/2023)   [employer] of Occupational Health - Occupational Stress Questionnaire    Feeling of Stress : Not at all  Social Connections: Not on file  Intimate Partner Violence: Not At Risk (04/21/2023)   Humiliation, Afraid, Rape, and Kick questionnaire    Fear of Current or Ex-Partner: No    Emotionally Abused: No    Physically Abused: No    Sexually Abused: No    Family History  Problem Relation Age of Onset   Coronary artery disease Mother    Breast cancer Paternal Grandmother 59    No Known Allergies  Outpatient Medications Prior to Visit  Medication Sig   alendronate  (FOSAMAX ) 70 MG tablet Take 1 tablet (70 mg total) by mouth once a week. Take with a full glass of water on an empty stomach.   atenolol  (TENORMIN ) 25 MG tablet Take 1 tablet (25 mg total) by mouth daily.   DiphenhydrAMINE HCl (ALLERGY MED PO) Take by mouth.   rosuvastatin  (CRESTOR ) 20 MG tablet Take 1 tablet (20 mg total) by mouth daily.   scopolamine  (TRANSDERM-SCOP) 1 MG/3DAYS Place 1 patch (1.5 mg total) onto  the skin every 3 (three) days.   spironolactone  (ALDACTONE ) 25 MG tablet Take 1 tablet (25 mg total) by mouth daily.   No facility-administered medications prior to visit.    Review of Systems  Constitutional: Negative.  Negative for chills, diaphoresis, fever, malaise/fatigue and weight loss.  HENT: Negative.  Negative for sore throat.   Eyes: Negative.   Respiratory: Negative.  Negative for cough and shortness of breath.   Cardiovascular: Negative.  Negative for chest pain, palpitations and leg swelling.  Gastrointestinal: Negative.  Negative for abdominal pain, constipation, diarrhea, heartburn, nausea and vomiting.  Genitourinary: Negative.  Negative for dysuria and flank pain.  Musculoskeletal: Negative.  Negative for joint pain and myalgias.  Skin: Negative.   Neurological: Negative.  Negative for dizziness, tingling, tremors, sensory change and  headaches.  Endo/Heme/Allergies: Negative.   Psychiatric/Behavioral: Negative.  Negative for depression and suicidal ideas. The patient is not nervous/anxious.        Objective:   BP 104/76   Pulse 65   Ht 5' (1.524 m)   Wt 140 lb 6.4 oz (63.7 kg)   SpO2 99%   BMI 27.42 kg/m   Vitals:   10/25/23 0909  BP: 104/76  Pulse: 65  Height: 5' (1.524 m)  Weight: 140 lb 6.4 oz (63.7 kg)  SpO2: 99%  BMI (Calculated): 27.42    Physical Exam Vitals and nursing note reviewed.  Constitutional:      Appearance: Normal appearance.  HENT:     Head: Normocephalic and atraumatic.     Nose: Nose normal.     Mouth/Throat:     Mouth: Mucous membranes are moist.     Pharynx: Oropharynx is clear.  Eyes:     Conjunctiva/sclera: Conjunctivae normal.     Pupils: Pupils are equal, round, and reactive to light.  Cardiovascular:     Rate and Rhythm: Normal rate and regular rhythm.     Pulses: Normal pulses.     Heart sounds: Normal heart sounds. No murmur heard. Pulmonary:     Effort: Pulmonary effort is normal.     Breath sounds: Normal breath sounds. No wheezing.  Abdominal:     General: Bowel sounds are normal.     Palpations: Abdomen is soft.     Tenderness: There is no abdominal tenderness. There is no right CVA tenderness or left CVA tenderness.  Musculoskeletal:        General: Normal range of motion.     Cervical back: Normal range of motion.     Right lower leg: No edema.     Left lower leg: No edema.  Skin:    General: Skin is warm and dry.  Neurological:     General: No focal deficit present.     Mental Status: She is alert and oriented to person, place, and time.  Psychiatric:        Mood and Affect: Mood normal.        Behavior: Behavior normal.      No results found for any visits on 10/25/23.  Recent Results (from the past 2160 hours)  POCT urinalysis dipstick     Status: Normal   Collection Time: 07/27/23 12:09 PM  Result Value Ref Range   Color, UA yellow     Clarity, UA clear    Glucose, UA negative mg/dL   Bilirubin, UA negative    Ketones, POC UA negative mg/dL   Spec Grav, UA 8.984    Blood, UA negative    pH, UA  6.0    Protein Ur, POC negative mg/dL   Urobilinogen, UA 0.2 E.U./dL   Nitrite, UA Negative    Leukocytes, UA Negative       Assessment & Plan:  Continue current medications.  Monitor blood pressure at home.  Check labs today. Problem List Items Addressed This Visit     Hyperlipidemia   Relevant Orders   CMP14+EGFR   Lipid Panel w/o Chol/HDL Ratio   Other Visit Diagnoses       Essential hypertension, benign    -  Primary       Return in about 6 months (around 04/26/2024).   Total time spent: 30 minutes  FERNAND FREDY RAMAN, MD  10/25/2023   This document may have been prepared by [facility] Voice Recognition software and as such may include unintentional dictation errors.

## 2023-10-27 ENCOUNTER — Ambulatory Visit: Payer: Self-pay | Admitting: Internal Medicine

## 2023-10-27 LAB — CMP14+EGFR
ALT: 17 IU/L (ref 0–32)
AST: 23 IU/L (ref 0–40)
Albumin: 4.4 g/dL (ref 3.9–4.9)
Alkaline Phosphatase: 63 IU/L (ref 44–121)
BUN/Creatinine Ratio: 19 (ref 12–28)
BUN: 22 mg/dL (ref 8–27)
Bilirubin Total: 0.4 mg/dL (ref 0.0–1.2)
CO2: 20 mmol/L (ref 20–29)
Calcium: 10.6 mg/dL — ABNORMAL HIGH (ref 8.7–10.3)
Chloride: 103 mmol/L (ref 96–106)
Creatinine, Ser: 1.15 mg/dL — ABNORMAL HIGH (ref 0.57–1.00)
Globulin, Total: 2.2 g/dL (ref 1.5–4.5)
Glucose: 85 mg/dL (ref 70–99)
Potassium: 5.2 mmol/L (ref 3.5–5.2)
Sodium: 141 mmol/L (ref 134–144)
Total Protein: 6.6 g/dL (ref 6.0–8.5)
eGFR: 52 mL/min/1.73 — ABNORMAL LOW (ref >59)

## 2023-10-27 LAB — LIPID PANEL W/O CHOL/HDL RATIO
Cholesterol, Total: 160 mg/dL (ref 100–199)
HDL: 47 mg/dL (ref >39)
LDL Chol Calc (NIH): 87 mg/dL (ref 0–99)
Triglycerides: 150 mg/dL — ABNORMAL HIGH (ref 0–149)
VLDL Cholesterol Cal: 26 mg/dL (ref 5–40)

## 2023-10-27 NOTE — Progress Notes (Signed)
 Patient notified

## 2023-12-06 ENCOUNTER — Encounter: Payer: Self-pay | Admitting: Cardiology

## 2023-12-06 ENCOUNTER — Ambulatory Visit (INDEPENDENT_AMBULATORY_CARE_PROVIDER_SITE_OTHER): Admitting: Cardiology

## 2023-12-06 VITALS — BP 114/77 | HR 62 | Ht 60.0 in | Wt 134.0 lb

## 2023-12-06 DIAGNOSIS — R42 Dizziness and giddiness: Secondary | ICD-10-CM | POA: Diagnosis not present

## 2023-12-06 DIAGNOSIS — Z0130 Encounter for examination of blood pressure without abnormal findings: Secondary | ICD-10-CM

## 2023-12-06 MED ORDER — AZITHROMYCIN 250 MG PO TABS: ORAL_TABLET | ORAL | 0 refills | Status: AC

## 2023-12-06 NOTE — Progress Notes (Signed)
 Established Patient Office Visit  Subjective:  Patient ID: Alexa Navarro, female    DOB: [birth year]  Age: 68 y.o. MRN: [id number]  Chief Complaint  Patient presents with   Acute Visit    Vertigo/inner ear issue since 3pm Sunday     Patient in office for an acute visit, complaining of vertigo, inner ear problems since Sunday early morning. Woke up early Sunday morning with veritgo, attempted to go to the restroom, diaphoretic, vomiting. Sinus pressure left worse than right. Ear fullness. Has been taking Mexlizine and Dramamine with minimal relief. Has had problems with vertigo in the past. Will send in a Z-pack. Will refer to ENT for recurrent vertigo.   Dizziness This is a new problem. The current episode started in the past 7 days. Associated symptoms include diaphoresis, vertigo and vomiting. Pertinent negatives include no abdominal pain, chest pain, congestion, coughing, headaches, myalgias or sore throat.    No other concerns at this time.   Past Medical History:  Diagnosis Date   Allergic rhinitis due to pollen    Allergy    Chronic bilateral back pain    GERD (gastroesophageal reflux disease)    Hyperlipidemia    Hypertension    Kidney stones    Palpitations    Trochanteric bursitis of right hip     Past Surgical History:  Procedure Laterality Date   APPENDECTOMY     BTL     Right Rotator cuff repair     TONSILLECTOMY Bilateral     Social History   Socioeconomic History   Marital status: Married    Spouse name: Not on file   Number of children: Not on file   Years of education: Not on file   Highest education level: Not on file  Occupational History   Not on file  Tobacco Use   Smoking status: Never   Smokeless tobacco: Never  Substance and Sexual Activity   Alcohol use: No    Alcohol/week: 0.0 standard drinks of alcohol   Drug use: Not on file   Sexual activity: Not on file  Other Topics Concern   Not on file  Social History Narrative   Not on  file   Social Drivers of Health   Financial Resource Strain: Not on file  Food Insecurity: No Food Insecurity (04/21/2023)   Hunger Vital Sign    Worried About Running Out of Food in the Last Year: Never true    Ran Out of Food in the Last Year: Never true  Transportation Needs: No Transportation Needs (04/21/2023)   PRAPARE - Administrator, Civil Service (Medical): No    Lack of Transportation (Non-Medical): No  Physical Activity: Not on file  Stress: No Stress Concern Present (04/21/2023)   [employer] of Occupational Health - Occupational Stress Questionnaire    Feeling of Stress : Not at all  Social Connections: Not on file  Intimate Partner Violence: Not At Risk (04/21/2023)   Humiliation, Afraid, Rape, and Kick questionnaire    Fear of Current or Ex-Partner: No    Emotionally Abused: No    Physically Abused: No    Sexually Abused: No    Family History  Problem Relation Age of Onset   Coronary artery disease Mother    Breast cancer Paternal Grandmother 90    No Known Allergies  Outpatient Medications Prior to Visit  Medication Sig   alendronate  (FOSAMAX ) 70 MG tablet Take 1 tablet (70 mg total) by mouth once a week. Take  with a full glass of water on an empty stomach.   atenolol  (TENORMIN ) 25 MG tablet Take 1 tablet (25 mg total) by mouth daily.   DiphenhydrAMINE HCl (ALLERGY MED PO) Take by mouth.   rosuvastatin  (CRESTOR ) 20 MG tablet Take 1 tablet (20 mg total) by mouth daily.   scopolamine  (TRANSDERM-SCOP) 1 MG/3DAYS Place 1 patch (1.5 mg total) onto the skin every 3 (three) days.   spironolactone  (ALDACTONE ) 25 MG tablet Take 1 tablet (25 mg total) by mouth daily.   No facility-administered medications prior to visit.    Review of Systems  Constitutional:  Positive for diaphoresis.  HENT:  Positive for sinus pain and tinnitus. Negative for congestion and sore throat.        Vertigo  Eyes: Negative.   Respiratory: Negative.  Negative for cough,  sputum production and shortness of breath.   Cardiovascular: Negative.  Negative for chest pain.  Gastrointestinal:  Positive for vomiting. Negative for abdominal pain, constipation and diarrhea.  Genitourinary: Negative.   Musculoskeletal:  Negative for joint pain and myalgias.  Skin: Negative.   Neurological:  Positive for dizziness and vertigo. Negative for headaches.  Endo/Heme/Allergies: Negative.   All other systems reviewed and are negative.      Objective:   BP 114/77   Pulse 62   Ht 5' (1.524 m)   Wt 134 lb (60.8 kg)   SpO2 96%   BMI 26.17 kg/m   Vitals:   12/06/23 1119  BP: 114/77  Pulse: 62  Height: 5' (1.524 m)  Weight: 134 lb (60.8 kg)  SpO2: 96%  BMI (Calculated): 26.17    Physical Exam Vitals and nursing note reviewed.  Constitutional:      Appearance: Normal appearance. She is normal weight.  HENT:     Head: Normocephalic and atraumatic.     Nose: Nose normal.     Mouth/Throat:     Mouth: Mucous membranes are moist.  Eyes:     Extraocular Movements: Extraocular movements intact.     Conjunctiva/sclera: Conjunctivae normal.     Pupils: Pupils are equal, round, and reactive to light.  Cardiovascular:     Rate and Rhythm: Normal rate and regular rhythm.     Pulses: Normal pulses.     Heart sounds: Normal heart sounds.  Pulmonary:     Effort: Pulmonary effort is normal.     Breath sounds: Normal breath sounds.  Abdominal:     General: Abdomen is flat. Bowel sounds are normal.     Palpations: Abdomen is soft.  Musculoskeletal:        General: Normal range of motion.     Cervical back: Normal range of motion.  Skin:    General: Skin is warm and dry.  Neurological:     General: No focal deficit present.     Mental Status: She is alert and oriented to person, place, and time.  Psychiatric:        Mood and Affect: Mood normal.        Behavior: Behavior normal.        Thought Content: Thought content normal.        Judgment: Judgment normal.       No results found for any visits on 12/06/23.  Recent Results (from the past 2160 hours)  CMP14+EGFR     Status: Abnormal   Collection Time: 10/25/23  9:33 AM  Result Value Ref Range   Glucose 85 70 - 99 mg/dL   BUN 22 8 -  27 mg/dL   Creatinine, Ser 8.84 (H) 0.57 - 1.00 mg/dL   eGFR 52 (L) >40 fO/fpw/8.26   BUN/Creatinine Ratio 19 12 - 28   Sodium 141 134 - 144 mmol/L   Potassium 5.2 3.5 - 5.2 mmol/L   Chloride 103 96 - 106 mmol/L   CO2 20 20 - 29 mmol/L   Calcium  10.6 (H) 8.7 - 10.3 mg/dL   Total Protein 6.6 6.0 - 8.5 g/dL   Albumin 4.4 3.9 - 4.9 g/dL   Globulin, Total 2.2 1.5 - 4.5 g/dL   Bilirubin Total 0.4 0.0 - 1.2 mg/dL   Alkaline Phosphatase 63 44 - 121 IU/L   AST 23 0 - 40 IU/L   ALT 17 0 - 32 IU/L  Lipid Panel w/o Chol/HDL Ratio     Status: Abnormal   Collection Time: 10/25/23  9:33 AM  Result Value Ref Range   Cholesterol, Total 160 100 - 199 mg/dL   Triglycerides 849 (H) 0 - 149 mg/dL   HDL 47 >60 mg/dL   VLDL Cholesterol Cal 26 5 - 40 mg/dL   LDL Chol Calc (NIH) 87 0 - 99 mg/dL      Assessment & Plan:  Z-pack Referral sent to ENT  Problem List Items Addressed This Visit       Other   Vertigo - Primary   Relevant Orders   Ambulatory referral to ENT    Return if symptoms worsen or fail to improve.   Total time spent: 25 minutes  [employer], NP  12/06/2023   This document may have been prepared by [employer] Voice Recognition software and as such may include unintentional dictation errors.

## 2023-12-14 DIAGNOSIS — R42 Dizziness and giddiness: Secondary | ICD-10-CM | POA: Diagnosis not present

## 2023-12-14 DIAGNOSIS — H90A22 Sensorineural hearing loss, unilateral, left ear, with restricted hearing on the contralateral side: Secondary | ICD-10-CM | POA: Diagnosis not present

## 2024-01-05 DIAGNOSIS — H8111 Benign paroxysmal vertigo, right ear: Secondary | ICD-10-CM | POA: Diagnosis not present

## 2024-03-31 ENCOUNTER — Other Ambulatory Visit: Payer: Self-pay | Admitting: Internal Medicine

## 2024-03-31 DIAGNOSIS — E782 Mixed hyperlipidemia: Secondary | ICD-10-CM

## 2024-04-13 ENCOUNTER — Other Ambulatory Visit: Payer: Self-pay | Admitting: Internal Medicine

## 2024-04-13 DIAGNOSIS — R002 Palpitations: Secondary | ICD-10-CM

## 2024-04-18 ENCOUNTER — Other Ambulatory Visit: Payer: Self-pay | Admitting: Internal Medicine

## 2024-04-18 DIAGNOSIS — R0602 Shortness of breath: Secondary | ICD-10-CM

## 2024-04-27 ENCOUNTER — Ambulatory Visit
Admission: RE | Admit: 2024-04-27 | Discharge: 2024-04-27 | Disposition: A | Discharge status: Home / Self Care | Source: Ambulatory Visit | Attending: Internal Medicine | Admitting: Internal Medicine

## 2024-04-27 ENCOUNTER — Ambulatory Visit: Admitting: Internal Medicine

## 2024-04-27 ENCOUNTER — Ambulatory Visit
Admission: RE | Admit: 2024-04-27 | Discharge: 2024-04-27 | Disposition: A | Discharge status: Home / Self Care | Attending: Internal Medicine | Admitting: Internal Medicine

## 2024-04-27 ENCOUNTER — Encounter: Payer: Self-pay | Admitting: Internal Medicine

## 2024-04-27 ENCOUNTER — Ambulatory Visit: Payer: Self-pay | Admitting: Internal Medicine

## 2024-04-27 VITALS — BP 92/68 | HR 61 | Ht 60.0 in | Wt 135.4 lb

## 2024-04-27 DIAGNOSIS — E663 Overweight: Secondary | ICD-10-CM | POA: Diagnosis not present

## 2024-04-27 DIAGNOSIS — E782 Mixed hyperlipidemia: Secondary | ICD-10-CM

## 2024-04-27 DIAGNOSIS — E559 Vitamin D deficiency, unspecified: Secondary | ICD-10-CM

## 2024-04-27 DIAGNOSIS — Z6826 Body mass index (BMI) 26.0-26.9, adult: Secondary | ICD-10-CM | POA: Diagnosis not present

## 2024-04-27 DIAGNOSIS — M25512 Pain in left shoulder: Secondary | ICD-10-CM | POA: Insufficient documentation

## 2024-04-27 DIAGNOSIS — I10 Essential (primary) hypertension: Secondary | ICD-10-CM | POA: Diagnosis not present

## 2024-04-27 MED ORDER — PREDNISONE 20 MG PO TABS: 40.0000 mg | ORAL_TABLET | Freq: Every day | ORAL | 0 refills | Status: DC

## 2024-04-27 MED ORDER — BACLOFEN 10 MG PO TABS: 10.0000 mg | ORAL_TABLET | Freq: Every evening | ORAL | 1 refills | Status: DC | PRN

## 2024-04-27 NOTE — Progress Notes (Signed)
 "  Established Patient Office Visit  Subjective:  Patient ID: Alexa Navarro, female    DOB: February 08, 1956  Age: 69 y.o. MRN: [id number]  Chief Complaint  Patient presents with   Follow-up    6 month follow up    Patient is here today for routine follow up. She reports overall feeling well today.  She reports hurting her left shoulder that around 11/25. She reports pain that goes down her left arm. She describes it a constant ache with burning sensation down her left arm. Will send her to get neck and left shoulder xray. She has done heat and ice without relief. Ibuprofen at night to help her sleep. Will start prednisone  burst and baclofen  at bedtime. Discussed future referral to Orthopedic as needed. Recommend patient continue simple ROM and stretches daily.  She continues to eat healthy diet and exercise regularly. She will get fasting blood work completed today. Patient will be due for mammogram and Bone density later this year. Patient will get Medicare AWV completed at her next routine FU as well.      No other concerns at this time.   Past Medical History:  Diagnosis Date   Allergic rhinitis due to pollen    Allergy    Chronic bilateral back pain    GERD (gastroesophageal reflux disease)    Hyperlipidemia    Hypertension    Kidney stones    Palpitations    Trochanteric bursitis of right hip     Past Surgical History:  Procedure Laterality Date   APPENDECTOMY     BTL     Right Rotator cuff repair     TONSILLECTOMY Bilateral     Social History   Socioeconomic History   Marital status: Married    Spouse name: Not on file   Number of children: Not on file   Years of education: Not on file   Highest education level: Not on file  Occupational History   Not on file  Tobacco Use   Smoking status: Never   Smokeless tobacco: Never  Substance and Sexual Activity   Alcohol use: No    Alcohol/week: 0.0 standard drinks of alcohol   Drug use: Not on file   Sexual  activity: Not on file  Other Topics Concern   Not on file  Social History Narrative   Not on file   Social Drivers of Health   Tobacco Use: Low Risk (04/27/2024)   Patient History    Smoking Tobacco Use: Never    Smokeless Tobacco Use: Never    Passive Exposure: Not on file  Financial Resource Strain: Not on file  Food Insecurity: No Food Insecurity (04/21/2023)   Hunger Vital Sign    Worried About Running Out of Food in the Last Year: Never true    Ran Out of Food in the Last Year: Never true  Transportation Needs: No Transportation Needs (04/21/2023)   PRAPARE - Administrator, Civil Service (Medical): No    Lack of Transportation (Non-Medical): No  Physical Activity: Not on file  Stress: No Stress Concern Present (04/21/2023)   [employer] of Occupational Health - Occupational Stress Questionnaire    Feeling of Stress : Not at all  Social Connections: Not on file  Intimate Partner Violence: Not At Risk (04/21/2023)   Humiliation, Afraid, Rape, and Kick questionnaire    Fear of Current or Ex-Partner: No    Emotionally Abused: No    Physically Abused: No    Sexually Abused:  No  Depression (PHQ2-9): Low Risk (04/21/2023)   Depression (PHQ2-9)    PHQ-2 Score: 0  Alcohol Screen: Not on file  Housing: Unknown (04/21/2023)   Housing Stability Vital Sign    Unable to Pay for Housing in the Last Year: No    Number of Times Moved in the Last Year: Not on file    Homeless in the Last Year: No  Utilities: Not At Risk (04/21/2023)   [employer] Utilities    Threatened with loss of utilities: No  Health Literacy: Adequate Health Literacy (04/21/2023)   B1300 Health Literacy    Frequency of need for help with medical instructions: Never    Family History  Problem Relation Age of Onset   Coronary artery disease Mother    Breast cancer Paternal Grandmother 60    Allergies[1]  Show/hide medication list[2]  Review of Systems  Constitutional: Negative.  Negative for chills, fever  and malaise/fatigue.  HENT: Negative.  Negative for congestion and sore throat.   Eyes: Negative.  Negative for blurred vision and pain.  Respiratory: Negative.  Negative for cough and shortness of breath.   Cardiovascular: Negative.  Negative for chest pain, palpitations and leg swelling.  Gastrointestinal: Negative.  Negative for abdominal pain, blood in stool, constipation, diarrhea, heartburn, melena, nausea and vomiting.  Genitourinary: Negative.  Negative for dysuria, flank pain, frequency and urgency.  Musculoskeletal:  Positive for joint pain (left shoulder) and neck pain. Negative for myalgias.  Skin: Negative.   Neurological:  Positive for sensory change (burning sensation down left arm). Negative for dizziness, tingling, weakness and headaches.  Endo/Heme/Allergies: Negative.   Psychiatric/Behavioral: Negative.  Negative for depression and suicidal ideas. The patient is not nervous/anxious.        Objective:   BP 92/68   Pulse 61   Ht 5' (1.524 m)   Wt 135 lb 6.4 oz (61.4 kg)   SpO2 97%   BMI 26.44 kg/m   Vitals:   04/27/24 0914  BP: 92/68  Pulse: 61  Height: 5' (1.524 m)  Weight: 135 lb 6.4 oz (61.4 kg)  SpO2: 97%  BMI (Calculated): 26.44    Physical Exam Vitals and nursing note reviewed.  Constitutional:      Appearance: Normal appearance.  HENT:     Head: Normocephalic and atraumatic.     Nose: Nose normal.     Mouth/Throat:     Mouth: Mucous membranes are moist.     Pharynx: Oropharynx is clear.  Eyes:     Conjunctiva/sclera: Conjunctivae normal.     Pupils: Pupils are equal, round, and reactive to light.  Cardiovascular:     Rate and Rhythm: Normal rate and regular rhythm.     Pulses: Normal pulses.     Heart sounds: Normal heart sounds. No murmur heard. Pulmonary:     Effort: Pulmonary effort is normal.     Breath sounds: Normal breath sounds. No wheezing.  Abdominal:     General: Bowel sounds are normal.     Palpations: Abdomen is soft.      Tenderness: There is no abdominal tenderness. There is no right CVA tenderness or left CVA tenderness.  Musculoskeletal:        General: Normal range of motion.     Cervical back: Normal range of motion.     Right lower leg: No edema.     Left lower leg: No edema.  Skin:    General: Skin is warm and dry.  Neurological:     General:  No focal deficit present.     Mental Status: She is alert and oriented to person, place, and time.  Psychiatric:        Mood and Affect: Mood normal.        Behavior: Behavior normal.      No results found for any visits on 04/27/24.  No results found for this or any previous visit (from the past 2160 hours).    Assessment & Plan:  Start prednisone  burst. Start baclofen . Continue other medications as prescribed. Get routine blood work completed today and FU with patient on results. Cervical and left shoulder xray ordered.  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 Problem List Items Addressed This Visit       Cardiovascular and Mediastinum   Essential hypertension, benign - Primary   Relevant Orders   CMP14+EGFR   CBC with Diff     Other   Hyperlipidemia   Relevant Orders   CMP14+EGFR   Lipid Panel w/o Chol/HDL Ratio   Vitamin D  deficiency   Relevant Orders   Vitamin D  (25 hydroxy)   Acute pain of left shoulder   Relevant Medications   predniSONE  (DELTASONE ) 20 MG tablet   baclofen  (LIORESAL ) 10 MG tablet   Other Relevant Orders   DG Shoulder Left (Completed)   DG Cervical Spine Complete (Completed)   Overweight with body mass index (BMI) of 26 to 26.9 in adult    Return in about 10 days (around 05/07/2024).   Total time spent: 30 minutes. This time includes review of previous notes and results and patient face to face interaction during today's visit.    FERNAND FREDY RAMAN, MD  04/27/2024   This document may have been prepared by [facility] Voice Recognition software and as such may include unintentional  dictation errors.      [1] No Known Allergies [2]  Outpatient Medications Prior to Visit  Medication Sig   alendronate  (FOSAMAX ) 70 MG tablet Take 1 tablet (70 mg total) by mouth once a week. Take with a full glass of water on an empty stomach.   atenolol  (TENORMIN ) 25 MG tablet TAKE 1 TABLET (25 MG TOTAL) BY MOUTH DAILY.   DiphenhydrAMINE HCl (ALLERGY MED PO) Take by mouth.   rosuvastatin  (CRESTOR ) 20 MG tablet TAKE 1 TABLET BY MOUTH EVERY DAY   spironolactone  (ALDACTONE ) 25 MG tablet TAKE 1 TABLET (25 MG TOTAL) BY MOUTH DAILY.   scopolamine  (TRANSDERM-SCOP) 1 MG/3DAYS Place 1 patch (1.5 mg total) onto the skin every 3 (three) days. (Patient not taking: Reported on 04/27/2024)   No facility-administered medications prior to visit.   "

## 2024-04-27 NOTE — Progress Notes (Signed)
 Patient notified

## 2024-04-28 LAB — CMP14+EGFR
ALT: 25 IU/L (ref 0–32)
AST: 25 IU/L (ref 0–40)
Albumin: 4.4 g/dL (ref 3.9–4.9)
Alkaline Phosphatase: 64 IU/L (ref 49–135)
BUN/Creatinine Ratio: 22 (ref 12–28)
BUN: 25 mg/dL (ref 8–27)
Bilirubin Total: 0.5 mg/dL (ref 0.0–1.2)
CO2: 22 mmol/L (ref 20–29)
Calcium: 10.4 mg/dL — ABNORMAL HIGH (ref 8.7–10.3)
Chloride: 103 mmol/L (ref 96–106)
Creatinine, Ser: 1.15 mg/dL — ABNORMAL HIGH (ref 0.57–1.00)
Globulin, Total: 2.2 g/dL (ref 1.5–4.5)
Glucose: 82 mg/dL (ref 70–99)
Potassium: 4.4 mmol/L (ref 3.5–5.2)
Sodium: 141 mmol/L (ref 134–144)
Total Protein: 6.6 g/dL (ref 6.0–8.5)
eGFR: 52 mL/min/1.73 — ABNORMAL LOW

## 2024-04-28 LAB — VITAMIN D 25 HYDROXY (VIT D DEFICIENCY, FRACTURES): Vit D, 25-Hydroxy: 25.7 ng/mL — ABNORMAL LOW (ref 30.0–100.0)

## 2024-04-28 LAB — CBC WITH DIFFERENTIAL/PLATELET
Basophils Absolute: 0.1 x10E3/uL (ref 0.0–0.2)
Basos: 1 %
EOS (ABSOLUTE): 0.6 x10E3/uL — ABNORMAL HIGH (ref 0.0–0.4)
Eos: 7 %
Hematocrit: 47.6 % — ABNORMAL HIGH (ref 34.0–46.6)
Hemoglobin: 15.6 g/dL (ref 11.1–15.9)
Immature Grans (Abs): 0 x10E3/uL (ref 0.0–0.1)
Immature Granulocytes: 0 %
Lymphocytes Absolute: 2.4 x10E3/uL (ref 0.7–3.1)
Lymphs: 28 %
MCH: 30.8 pg (ref 26.6–33.0)
MCHC: 32.8 g/dL (ref 31.5–35.7)
MCV: 94 fL (ref 79–97)
Monocytes Absolute: 0.8 x10E3/uL (ref 0.1–0.9)
Monocytes: 10 %
Neutrophils Absolute: 4.7 x10E3/uL (ref 1.4–7.0)
Neutrophils: 54 %
Platelets: 223 x10E3/uL (ref 150–450)
RBC: 5.07 x10E6/uL (ref 3.77–5.28)
RDW: 12.7 % (ref 11.7–15.4)
WBC: 8.6 x10E3/uL (ref 3.4–10.8)

## 2024-04-28 LAB — LIPID PANEL W/O CHOL/HDL RATIO
Cholesterol, Total: 169 mg/dL (ref 100–199)
HDL: 51 mg/dL
LDL Chol Calc (NIH): 91 mg/dL (ref 0–99)
Triglycerides: 157 mg/dL — ABNORMAL HIGH (ref 0–149)
VLDL Cholesterol Cal: 27 mg/dL (ref 5–40)

## 2024-05-02 NOTE — Progress Notes (Signed)
 Patient notified

## 2024-05-04 ENCOUNTER — Other Ambulatory Visit: Payer: Self-pay | Admitting: Internal Medicine

## 2024-05-04 DIAGNOSIS — M25512 Pain in left shoulder: Secondary | ICD-10-CM

## 2024-05-10 ENCOUNTER — Ambulatory Visit: Admitting: Internal Medicine

## 2024-05-10 ENCOUNTER — Encounter: Payer: Self-pay | Admitting: Internal Medicine

## 2024-05-10 VITALS — BP 100/68 | HR 71 | Ht 60.0 in | Wt 136.8 lb

## 2024-05-10 DIAGNOSIS — I10 Essential (primary) hypertension: Secondary | ICD-10-CM | POA: Diagnosis not present

## 2024-05-10 DIAGNOSIS — Z6826 Body mass index (BMI) 26.0-26.9, adult: Secondary | ICD-10-CM | POA: Diagnosis not present

## 2024-05-10 DIAGNOSIS — E663 Overweight: Secondary | ICD-10-CM

## 2024-05-10 DIAGNOSIS — M5412 Radiculopathy, cervical region: Secondary | ICD-10-CM

## 2024-05-10 DIAGNOSIS — E782 Mixed hyperlipidemia: Secondary | ICD-10-CM

## 2024-05-10 MED ORDER — MELOXICAM 7.5 MG PO TABS: 7.5000 mg | ORAL_TABLET | Freq: Every day | ORAL | 2 refills | Status: AC

## 2024-05-10 NOTE — Progress Notes (Signed)
 "  Established Patient Office Visit  Subjective:  Patient ID: Alexa Navarro, female    DOB: 1955-05-16  Age: 69 y.o. MRN: [id number]  Chief Complaint  Patient presents with   Follow-up    2 week follow up    Patient is here today for follow up. She reports feeling well today. Reports completing the steroids as prescribed and taking the muscle relaxers at bedtime.  She states that she has had some improvement in left shoulder pain but still feels tightness in the left side of her neck/shoulder. Patient had xrays completed 04/27/24 that showed arthritic changes in C5-7. She reports occasional numbness and tingling down her left arm and reports she has been doing ROM exercises and stretches. Discussed PT or medication for nerve pain. Patient would like to try Meloxicam  7.5 mg daily as needed over gabapentin.   Patient states she has had PT in the past when she had right rotator cuff surgery and remembers the exercises she had to do. Advised patient to drink plenty of water daily and avoid additional NSAID use. Patient educated she can take tylenol arthritis as needed. Reinforced conservative therapy, heat, massage, stretching at this time.  Patient is not due for labs as she recently had them completed.     No other concerns at this time.   Past Medical History:  Diagnosis Date   Allergic rhinitis due to pollen    Allergy    Chronic bilateral back pain    GERD (gastroesophageal reflux disease)    Hyperlipidemia    Hypertension    Kidney stones    Palpitations    Trochanteric bursitis of right hip     Past Surgical History:  Procedure Laterality Date   APPENDECTOMY     BTL     Right Rotator cuff repair     TONSILLECTOMY Bilateral     Social History   Socioeconomic History   Marital status: Married    Spouse name: Not on file   Number of children: Not on file   Years of education: Not on file   Highest education level: Not on file  Occupational History   Not on file   Tobacco Use   Smoking status: Never   Smokeless tobacco: Never  Substance and Sexual Activity   Alcohol use: No    Alcohol/week: 0.0 standard drinks of alcohol   Drug use: Not on file   Sexual activity: Not on file  Other Topics Concern   Not on file  Social History Narrative   Not on file   Social Drivers of Health   Tobacco Use: Low Risk (05/10/2024)   Patient History    Smoking Tobacco Use: Never    Smokeless Tobacco Use: Never    Passive Exposure: Not on file  Financial Resource Strain: Not on file  Food Insecurity: No Food Insecurity (04/21/2023)   Hunger Vital Sign    Worried About Running Out of Food in the Last Year: Never true    Ran Out of Food in the Last Year: Never true  Transportation Needs: No Transportation Needs (04/21/2023)   PRAPARE - Administrator, Civil Service (Medical): No    Lack of Transportation (Non-Medical): No  Physical Activity: Not on file  Stress: No Stress Concern Present (04/21/2023)   [employer] of Occupational Health - Occupational Stress Questionnaire    Feeling of Stress : Not at all  Social Connections: Not on file  Intimate Partner Violence: Not At Risk (04/21/2023)  Humiliation, Afraid, Rape, and Kick questionnaire    Fear of Current or Ex-Partner: No    Emotionally Abused: No    Physically Abused: No    Sexually Abused: No  Depression (PHQ2-9): Low Risk (04/21/2023)   Depression (PHQ2-9)    PHQ-2 Score: 0  Alcohol Screen: Not on file  Housing: Unknown (04/21/2023)   Housing Stability Vital Sign    Unable to Pay for Housing in the Last Year: No    Number of Times Moved in the Last Year: Not on file    Homeless in the Last Year: No  Utilities: Not At Risk (04/21/2023)   [employer] Utilities    Threatened with loss of utilities: No  Health Literacy: Adequate Health Literacy (04/21/2023)   B1300 Health Literacy    Frequency of need for help with medical instructions: Never    Family History  Problem Relation Age of Onset    Coronary artery disease Mother    Breast cancer Paternal Grandmother 72    Allergies[1]  Show/hide medication list[2]  Review of Systems  Constitutional: Negative.  Negative for chills, fever and malaise/fatigue.  HENT: Negative.  Negative for congestion and sore throat.   Eyes: Negative.  Negative for blurred vision and pain.  Respiratory: Negative.  Negative for cough and shortness of breath.   Cardiovascular: Negative.  Negative for chest pain, palpitations and leg swelling.  Gastrointestinal: Negative.  Negative for abdominal pain, blood in stool, constipation, diarrhea, heartburn, melena, nausea and vomiting.  Genitourinary: Negative.  Negative for dysuria, flank pain, frequency and urgency.  Musculoskeletal:  Positive for neck pain (left sided). Negative for joint pain and myalgias.  Skin: Negative.   Neurological:  Positive for sensory change (occasional numbness and tingling). Negative for dizziness, tingling, weakness and headaches.  Endo/Heme/Allergies: Negative.   Psychiatric/Behavioral: Negative.  Negative for depression and suicidal ideas. The patient is not nervous/anxious.        Objective:   BP 100/68   Pulse 71   Ht 5' (1.524 m)   Wt 136 lb 12.8 oz (62.1 kg)   SpO2 99%   BMI 26.72 kg/m   Vitals:   05/10/24 1321  BP: 100/68  Pulse: 71  Height: 5' (1.524 m)  Weight: 136 lb 12.8 oz (62.1 kg)  SpO2: 99%  BMI (Calculated): 26.72    Physical Exam Vitals and nursing note reviewed.  Constitutional:      Appearance: Normal appearance.  HENT:     Head: Normocephalic and atraumatic.     Nose: Nose normal.     Mouth/Throat:     Mouth: Mucous membranes are moist.     Pharynx: Oropharynx is clear.  Eyes:     Conjunctiva/sclera: Conjunctivae normal.     Pupils: Pupils are equal, round, and reactive to light.  Cardiovascular:     Rate and Rhythm: Normal rate and regular rhythm.     Pulses: Normal pulses.     Heart sounds: Normal heart sounds. No murmur  heard. Pulmonary:     Effort: Pulmonary effort is normal.     Breath sounds: Normal breath sounds. No wheezing.  Abdominal:     General: Bowel sounds are normal.     Palpations: Abdomen is soft.     Tenderness: There is no abdominal tenderness. There is no right CVA tenderness or left CVA tenderness.  Musculoskeletal:        General: Normal range of motion.     Cervical back: Normal range of motion.     Right  lower leg: No edema.     Left lower leg: No edema.  Skin:    General: Skin is warm and dry.  Neurological:     General: No focal deficit present.     Mental Status: She is alert and oriented to person, place, and time.  Psychiatric:        Mood and Affect: Mood normal.        Behavior: Behavior normal.      No results found for any visits on 05/10/24.  Recent Results (from the past 2160 hours)  CMP14+EGFR     Status: Abnormal   Collection Time: 04/27/24  9:46 AM  Result Value Ref Range   Glucose 82 70 - 99 mg/dL   BUN 25 8 - 27 mg/dL   Creatinine, Ser 8.84 (H) 0.57 - 1.00 mg/dL   eGFR 52 (L) >40 fO/fpw/8.26   BUN/Creatinine Ratio 22 12 - 28   Sodium 141 134 - 144 mmol/L   Potassium 4.4 3.5 - 5.2 mmol/L   Chloride 103 96 - 106 mmol/L   CO2 22 20 - 29 mmol/L   Calcium  10.4 (H) 8.7 - 10.3 mg/dL   Total Protein 6.6 6.0 - 8.5 g/dL   Albumin 4.4 3.9 - 4.9 g/dL   Globulin, Total 2.2 1.5 - 4.5 g/dL   Bilirubin Total 0.5 0.0 - 1.2 mg/dL   Alkaline Phosphatase 64 49 - 135 IU/L   AST 25 0 - 40 IU/L   ALT 25 0 - 32 IU/L  Lipid Panel w/o Chol/HDL Ratio     Status: Abnormal   Collection Time: 04/27/24  9:46 AM  Result Value Ref Range   Cholesterol, Total 169 100 - 199 mg/dL   Triglycerides 842 (H) 0 - 149 mg/dL   HDL 51 >60 mg/dL   VLDL Cholesterol Cal 27 5 - 40 mg/dL   LDL Chol Calc (NIH) 91 0 - 99 mg/dL  CBC with Diff     Status: Abnormal   Collection Time: 04/27/24  9:46 AM  Result Value Ref Range   WBC 8.6 3.4 - 10.8 x10E3/uL   RBC 5.07 3.77 - 5.28 x10E6/uL    Hemoglobin 15.6 11.1 - 15.9 g/dL   Hematocrit 52.3 (H) 65.9 - 46.6 %   MCV 94 79 - 97 fL   MCH 30.8 26.6 - 33.0 pg   MCHC 32.8 31.5 - 35.7 g/dL   RDW 87.2 88.2 - 84.5 %   Platelets 223 150 - 450 x10E3/uL   Neutrophils 54 Not Estab. %   Lymphs 28 Not Estab. %   Monocytes 10 Not Estab. %   Eos 7 Not Estab. %   Basos 1 Not Estab. %   Neutrophils Absolute 4.7 1.4 - 7.0 x10E3/uL   Lymphocytes Absolute 2.4 0.7 - 3.1 x10E3/uL   Monocytes Absolute 0.8 0.1 - 0.9 x10E3/uL   EOS (ABSOLUTE) 0.6 (H) 0.0 - 0.4 x10E3/uL   Basophils Absolute 0.1 0.0 - 0.2 x10E3/uL   Immature Granulocytes 0 Not Estab. %   Immature Grans (Abs) 0.0 0.0 - 0.1 x10E3/uL  Vitamin D  (25 hydroxy)     Status: Abnormal   Collection Time: 04/27/24  9:46 AM  Result Value Ref Range   Vit D, 25-Hydroxy 25.7 (L) 30.0 - 100.0 ng/mL    Comment: Vitamin D  deficiency has been defined by the Institute of Medicine and an Endocrine Society practice guideline as a level of serum 25-OH vitamin D  less than 20 ng/mL (1,2). The Endocrine Society went on to  further define vitamin D  insufficiency as a level between 21 and 29 ng/mL (2). 1. IOM (Institute of Medicine). 2010. Dietary reference    intakes for calcium  and D. [place]: The    [employer]. 2. Holick MF, Binkley Applegate, Bischoff-Ferrari HA, et al.    Evaluation, treatment, and prevention of vitamin D     deficiency: an Endocrine Society clinical practice    guideline. JCEM. 2011 Jul; 96(7):1911-30.       Assessment & Plan:  Start Meloxicam  7.5 mg daily as needed. Drink plenty of water daily. Continue other medications as prescribed. Discussed future PT or gabapentin for nerve pain. Reinforced healthy diet and exercise as tolerated. Problem List Items Addressed This Visit       Cardiovascular and Mediastinum   Essential hypertension, benign - Primary     Nervous and Auditory   Cervical radiculopathy   Relevant Medications   meloxicam  (MOBIC ) 7.5 MG tablet      Other   Hyperlipidemia   Overweight with body mass index (BMI) of 26 to 26.9 in adult    No follow-ups on file.   Total time spent: 25 minutes. This time includes review of previous notes and results and patient face to face interaction during today's visit.    FERNAND FREDY RAMAN, MD  05/10/2024   This document may have been prepared by [facility] Voice Recognition software and as such may include unintentional dictation errors.     [1] No Known Allergies [2]  Outpatient Medications Prior to Visit  Medication Sig   alendronate  (FOSAMAX ) 70 MG tablet Take 1 tablet (70 mg total) by mouth once a week. Take with a full glass of water on an empty stomach.   atenolol  (TENORMIN ) 25 MG tablet TAKE 1 TABLET (25 MG TOTAL) BY MOUTH DAILY.   baclofen  (LIORESAL ) 10 MG tablet TAKE 1 TABLET BY MOUTH AT BEDTIME AS NEEDED FOR MUSCLE SPASMS   DiphenhydrAMINE HCl (ALLERGY MED PO) Take by mouth.   rosuvastatin  (CRESTOR ) 20 MG tablet TAKE 1 TABLET BY MOUTH EVERY DAY   scopolamine  (TRANSDERM-SCOP) 1 MG/3DAYS Place 1 patch (1.5 mg total) onto the skin every 3 (three) days. (Patient not taking: Reported on 05/10/2024)   spironolactone  (ALDACTONE ) 25 MG tablet TAKE 1 TABLET (25 MG TOTAL) BY MOUTH DAILY.   [DISCONTINUED] predniSONE  (DELTASONE ) 20 MG tablet Take 2 tablets (40 mg total) by mouth daily with breakfast. (Patient not taking: Reported on 05/10/2024)   No facility-administered medications prior to visit.   "

## 2024-10-25 ENCOUNTER — Ambulatory Visit: Admitting: Internal Medicine
